# Patient Record
Sex: Male | Born: 1975 | Hispanic: Yes | Smoking: Never smoker
Health system: Southern US, Community
[De-identification: ages and names within clinical notes are randomized; demographics above are authoritative.]

## PROBLEM LIST (undated history)

## (undated) DIAGNOSIS — E119 Type 2 diabetes mellitus without complications: Secondary | ICD-10-CM

## (undated) HISTORY — DX: Type 2 diabetes mellitus without complications: E11.9

## (undated) HISTORY — DX: Morbid (severe) obesity due to excess calories: E66.01

## (undated) HISTORY — PX: FINGER SURGERY: SHX640

---

## 2013-11-10 ENCOUNTER — Ambulatory Visit (INDEPENDENT_AMBULATORY_CARE_PROVIDER_SITE_OTHER): Payer: Self-pay | Admitting: Family Medicine

## 2013-11-10 ENCOUNTER — Encounter: Payer: Self-pay | Admitting: Family Medicine

## 2013-11-10 VITALS — BP 126/84 | HR 85 | Temp 98.0°F | Resp 16 | Ht 67.0 in | Wt 259.2 lb

## 2013-11-10 DIAGNOSIS — M25549 Pain in joints of unspecified hand: Secondary | ICD-10-CM

## 2013-11-10 DIAGNOSIS — Z23 Encounter for immunization: Secondary | ICD-10-CM

## 2013-11-10 NOTE — Progress Notes (Signed)
Subjective:    Patient ID: John Coleman, male    DOB: 1975/12/27, 38 y.o.   MRN: [id number] This chart was scribed for John Staggers, MD by [name], Medical Scribe. The patient was seen in room 7. This patient's care was started at 2:00 PM.   HPI HPI Comments: John Coleman is a 38 y.o. male who presents to the [facility] with an avulsion to the right third finger occurring 30 minutes prior to arrival; bleeding uncontrolled. Pt states he was on his way to take his lunch break from work in [employer]. Says he had a knife in his hand and went to close a door behind him when the knife sliced his finger. Pt arrived with pressure applied using a white handkerchief that was drenched in blood. Last tetanus was more than 5 years ago. Pt is right hand dominant.  There are no active problems to display for this patient.  No past medical history on file. No past surgical history on file. Allergies not on file Prior to Admission medications   Not on File   History   Social History  . Marital Status: Married    Spouse Name: N/A    Number of Children: N/A  . Years of Education: N/A   Occupational History  . Not on file.   Social History Main Topics  . Smoking status: Not on file  . Smokeless tobacco: Not on file  . Alcohol Use: Not on file  . Drug Use: Not on file  . Sexual Activity: Not on file   Other Topics Concern  . Not on file   Social History Narrative  . No narrative on file   Review of Systems  Skin: Positive for rash.   Objective:  Physical Exam  Vitals reviewed. Constitutional: He is oriented to person, place, and time. He appears well-developed and well-nourished. No distress.  HENT:  Head: Normocephalic and atraumatic.  Eyes: EOM are normal.  Neck: Neck supple. No tracheal deviation present.  Cardiovascular: Normal rate.   Pulmonary/Chest: Effort normal. No respiratory distress.  Musculoskeletal: Normal range of motion.    Neurological: He is alert and oriented to person, place, and time.  Skin: Skin is warm and dry.  Sliced inside portion of third right finger. avulsion of skin into the soft tissue just distal to the nail. radial aspect of the finger pad. No apparent visualized bone but difficulty with bleeding. capillary refill less than one second at the tip of the finger. Nail was uninvolved.  Psychiatric: He has a normal mood and affect. His behavior is normal.   Filed Vitals:   11/10/13 1451  BP: 126/84  Pulse: 85  Temp: 98 F (36.7 C)  TempSrc: Oral  Resp: 16  Height: 5\' 7"  (1.702 m)  Weight: 259 lb 3.2 oz (117.572 kg)  SpO2: 97%     Assessment & Plan:  Lem Lans is a 38 y.o. male Need for prophylactic vaccination with combined diphtheria-tetanus-pertussis (DTP) vaccine - Plan: Tdap vaccine greater than or equal to 7yo IM  - avulsion of distal portion of R 3rd finger, occurred just PTA with clean knife by report while at work and closing door.  -Tdap given  -repaired via use of foil graft with plan on removal of sutures in 10-14 days. See procedure note. Wound care precautions given,  Including signs/symptoms of infections, discussed in spanish, with understanding expressed.   No orders of the defined types were placed in this encounter.  Patient Instructions  regrese en 10 -14 dias quitar suturas.  advil o tylenol si necesario por dolor. si mas rojo, mas dolor, mas enchado, o peor antes - regrese mas temprano.   CUIDADO DE LA HERIDA (Wound Care) Por favor vuelta en 10 das para hacer sus puntadas/ grapas quitar o ms pronto si usted tiene preocupaciones. [name]. Mantenga el rea limpia y seca por 24 horas. No quite el vendaje, si est aplicado. [name]. Despus de 24 horas, quita el vendaje y limpia la herida suavemente con cualquier tipo de [place] y agua tibia. Reaplique un vendaje nuevo despus de limpiar herida, si est dirigido. . Limpie la herida con el jabn y agua 1 a 2 veces al  [employer] representativeda hasta se quitan las puntadas. . No aplique ninguna ungentos ni cremas a la herida Wachovia Corporationmientras que las puntadas estn en lugar, pues sta puede causar curativo retrasado. . Notifique la oficina si usted tiene el siguiente de los muestras de la infeccin: Hinchazn, enrojecimiento, calor, drenaje del pus, de la fiebre > 101.0 F . Notifique la oficina si usted tiene la sangra excesiva eso no para despus de 15-20 minutos de presin firme y Risk analystconstante.

## 2013-11-10 NOTE — Patient Instructions (Signed)
regrese en 10 -14 dias quitar suturas.  advil o tylenol si necesario por dolor. si mas rojo, mas dolor, mas enchado, o peor antes - regrese mas temprano.   CUIDADO DE LA HERIDA (Wound Care) Por favor vuelta en 10 das para hacer sus puntadas/ grapas quitar o ms pronto si usted tiene preocupaciones. [name] el rea limpia y seca por 24 horas. No quite el vendaje, si est aplicado. [name] Despus de 24 horas, quita el vendaje y limpia la herida suavemente con cualquier tipo de [place] y agua tibia. Reaplique un vendaje nuevo despus de limpiar herida, si est dirigido. . Limpie la herida con el jabn y agua 1 a 2 veces al [employer] se quitan las puntadas. . No aplique ninguna ungentos ni cremas a la herida [employer] las puntadas estn en lugar, pues sta puede causar curativo retrasado. . Notifique la oficina si usted tiene el siguiente de los muestras de la infeccin: Hinchazn, enrojecimiento, calor, drenaje del pus, de la fiebre > 101.0 F . Notifique la oficina si usted tiene la sangra excesiva eso no para despus de 15-20 minutos de presin firme y Risk analyst.

## 2013-11-25 ENCOUNTER — Ambulatory Visit (INDEPENDENT_AMBULATORY_CARE_PROVIDER_SITE_OTHER): Payer: Self-pay | Admitting: Internal Medicine

## 2013-11-25 VITALS — BP 123/74 | HR 72 | Temp 99.1°F | Resp 18 | Ht 67.0 in | Wt 260.0 lb

## 2013-11-25 DIAGNOSIS — S61209A Unspecified open wound of unspecified finger without damage to nail, initial encounter: Secondary | ICD-10-CM

## 2013-11-25 NOTE — Patient Instructions (Signed)
Cuidados de una herida  (Wound Care)  El cuidado de la herida evita el dolor y la infeccin.  Deber aplicarse la vacuna contra el ttanos si:  No recuerda cundo se coloc la vacuna la ltima vez.  Nunca recibi esta vacuna.  La lesin ha Huntsman Corporationabierto su piel. Si usted necesita aplicarse la vacuna y se niega a recibirla, corre riesgo de contraer ttanos. sta es una enfermedad grave.  CUIDADOS EN EL HOGAR   Slo tome la medicacin segn las indicaciones.  Limpie la herida CarMaxtodos los das con [place] suave y [place], segn las indicaciones.  Cambie el (vendaje) tal como se le indic.  Aplique una crema con medicamento sobre la herida, segn le indique el mdico.  Cmbielo si se moja, se ensucia o huele mal.  Puede tomar Shaune Spittleuna ducha. No debe tomar baos de inmersin, nadar ni hacer nada que haga que la herida se encuentre debajo del agua.  Mantenga elevada y en reposo la zona lesionada hasta que el dolor y la hinchazn mejoren.  Cumpla con los controles mdicos segn las indicaciones. SOLICITE AYUDA DE INMEDIATO SI:   Observa que un lquido blanco amarillento (pus) aparece en la zona de la herida.  Los medicamentos no Editor, commissioningle calman el dolor.  Hay rayas rojas que salen de la herida.  Tiene fiebre. ASEGRESE DE QUE:   Comprende estas instrucciones.  Controlar su enfermedad.  Solicitar ayuda de inmediato si no mejora o si empeora. Document Released: 01/29/2011 Document Revised: 08/25/2011 [facility] Patient Information 2014 [employer], [place].

## 2013-11-25 NOTE — Progress Notes (Signed)
   Subjective:    Patient ID: John Coleman, male    DOB: 1976/06/06, 38 y.o.   MRN: [id number]  HPI Foil graft repair on distal finger pad has healed. No signs of infection Has full function.   Review of Systems     Objective:   Physical Exam  Constitutional: He is oriented to person, place, and time. He appears well-developed and well-nourished.  HENT:  Head: Normocephalic.  Eyes: EOM are normal.  Neck: Normal range of motion.  Pulmonary/Chest: Effort normal.  Musculoskeletal: Normal range of motion. He exhibits tenderness. He exhibits no edema.  Neurological: He is alert and oriented to person, place, and time. No cranial nerve deficit. He exhibits normal muscle tone. Coordination normal.  Skin: No rash noted.  Psychiatric: He has a normal mood and affect. His behavior is normal. Judgment and thought content normal.   All sutures removed  Wound healinf distal 3rd right finger pad, no reddness NMSV fully intact.       Assessment & Plan:  Wound care daily Protect wound.

## 2017-01-14 ENCOUNTER — Encounter (INDEPENDENT_AMBULATORY_CARE_PROVIDER_SITE_OTHER): Payer: Self-pay | Admitting: Physician Assistant

## 2017-01-14 ENCOUNTER — Ambulatory Visit (INDEPENDENT_AMBULATORY_CARE_PROVIDER_SITE_OTHER): Payer: Self-pay | Admitting: Physician Assistant

## 2017-01-14 VITALS — BP 131/76 | HR 73 | Temp 97.5°F | Ht 67.0 in | Wt 261.6 lb

## 2017-01-14 DIAGNOSIS — B353 Tinea pedis: Secondary | ICD-10-CM

## 2017-01-14 DIAGNOSIS — R1032 Left lower quadrant pain: Secondary | ICD-10-CM

## 2017-01-14 MED ORDER — CLOTRIMAZOLE 1 % EX CREA: 1.0000 "application " | TOPICAL_CREAM | Freq: Two times a day (BID) | CUTANEOUS | 0 refills | Status: DC

## 2017-01-14 NOTE — Progress Notes (Signed)
  Subjective:  Patient ID: John HaitSimon Hollingshead, male    DOB: [birth year]  Age: 41 y.o. MRN: [id number]  CC: abdominal pain  HPI John Coleman is a 41 y.o. male presents as a new patient with complaint of long standing hx of LLQ and LUQ pain. Attributed to a possible hernia. Worse with abdominal exercises and heavy lifting. Works in [employer] and feels that his pain is becoming more frequent. There is associated lump when pain is exacerbated. Also notes night sweats. Denies f/c/n/v, melena, BRBPR,BM dysfunction,weight loss, CP, palpitations, SOB, HA, swelling, rash, or GU sxs.    ROS Review of Systems  Constitutional: Negative for chills, fever and malaise/fatigue.  Eyes: Negative for blurred vision.  Respiratory: Negative for shortness of breath.   Cardiovascular: Negative for chest pain and palpitations.  Gastrointestinal: Positive for abdominal pain. Negative for nausea.  Genitourinary: Negative for dysuria and hematuria.  Musculoskeletal: Negative for joint pain and myalgias.  Skin: Positive for rash.  Neurological: Negative for tingling and headaches.  Psychiatric/Behavioral: Negative for depression. The patient is not nervous/anxious.     Objective:  BP 131/76 (BP Location: Left Arm, Patient Position: Sitting, Cuff Size: Large)   Pulse 73   Temp (!) 97.5 F (36.4 C) (Oral)   Ht 5\' 7"  (1.702 m)   Wt 261 lb 9.6 oz (118.7 kg)   SpO2 93%   BMI 40.97 kg/m   BP/Weight 01/14/2017 11/25/2013 11/10/2013  Systolic BP 131 123 126  Diastolic BP 76 74 84  Wt. (Lbs) 261.6 260 259.2  BMI 40.97 40.71 40.59      Physical Exam  Constitutional: He is oriented to person, place, and time.  Well developed, obese, NAD, polite  HENT:  Head: Normocephalic and atraumatic.  Eyes: No scleral icterus.  Neck: Normal range of motion. Neck supple. No thyromegaly present.  Cardiovascular: Normal rate, regular rhythm and normal heart sounds.   Pulmonary/Chest: Effort normal and breath sounds normal.   Abdominal: Soft. Bowel sounds are normal. There is tenderness (Focal LLQ TTP with no discernible hernia felt ).  Musculoskeletal: He exhibits no edema.  Neurological: He is alert and oriented to person, place, and time. No cranial nerve deficit. Coordination normal.  Skin: Skin is warm and dry. No rash noted. No erythema. No pallor.  Soles of feet erythematous and with desquamation  Psychiatric: He has a normal mood and affect. His behavior is normal. Thought content normal.  Vitals reviewed.    Assessment & Plan:    1. Left lower quadrant pain - CT Abdomen Pelvis W Contrast; Future - CBC with Differential - Comprehensive metabolic panel  2. Tinea pedis of both feet - Begin clotrimazole (LOTRIMIN) 1 % cream; Apply 1 application topically 2 (two) times daily.  Dispense: 30 g; Refill: 0   Meds ordered this encounter  Medications  . clotrimazole (LOTRIMIN) 1 % cream    Sig: Apply 1 application topically 2 (two) times daily.    Dispense:  30 g    Refill:  0    Order Specific Question:   Supervising Provider    Answer:   Quentin AngstJEGEDE, OLUGBEMIGA E [[id number]]    Follow-up: Return in about 2 weeks (around 01/28/2017).   Loletta Specteroger David Gomez PA

## 2017-01-14 NOTE — Patient Instructions (Signed)
Hernia en los adultos  (Hernia, Adult)  Una hernia es la protrusin de un rgano o un tejido a travs de un punto dbil en los msculos del abdomen (pared abdominal). La mayora de las veces, las hernias aparecen cerca del ombligo o de la ingle.  Hay muchos tipos de hernias. Los ms frecuentes incluyen los siguientes:   Hernia crural. Este tipo de hernia aparece por debajo de la ingle en la regin superior del muslo.   Hernia inguinal. Este tipo de hernia aparece en la ingle o en el escroto.   Hernia umbilical. Este tipo de hernia aparece cerca del ombligo.   Hernia de hiato. Este tipo de hernia produce el desplazamiento de una porcin del estmago hacia el trax.   Hernia incisional. Este tipo de hernia sobresale a travs de una cicatriz de una ciruga abdominal.  CAUSAS  Este trastorno puede ser causado por:   Levantar peso excesivo.   Toser durante mucho tiempo.   Dificultad para defecar.   Una incisin realizada durante una ciruga abdominal.   Un defecto congnito.   Sobrepeso u obesidad.   Fumar.   Dficit nutricional.   Fibrosis qustica.   Exceso de lquido en el abdomen.   Criptorquidia (testculos retenidos).  SNTOMAS  Los sntomas de una hernia incluyen lo siguiente:   Un bulto en el abdomen, que es el primer signo de una hernia. El bulto puede volverse ms evidente al estar de pie, hacer esfuerzos o toser. Puede aumentar de tamao con el tiempo si no se lo trata o si no se trata la afeccin que lo causa.   Dolor. Generalmente, las hernias son indoloras, pero pueden volverse dolorosas con el tiempo si se retrasa el tratamiento. El dolor suele ser sordo y puede ser ms intenso al estar de pie o levantar objetos pesados.  A veces, una hernia queda constreida en el punto dbil (estrangulada) o atascada (encarcelada) y causa ms sntomas. Estos sntomas pueden incluir los siguientes:   Vmitos.   Nuseas.   Estreimiento.   Irritabilidad.  DIAGNSTICO  El diagnstico de una hernia  puede realizarse mediante lo siguiente:   Un examen fsico. Durante el examen, el mdico puede pedirle que tosa o haga un movimiento especfico, porque, por lo general, una hernia es ms visible cuando la persona se mueve.   Diagnstico por imgenes. Estos pueden incluir los siguientes:  ? Radiografas.  ? Ecografa.  ? Tomografa computarizada.  TRATAMIENTO  Es posible que una hernia pequea e indolora no necesite tratamiento. Una hernia grande o dolorosa puede tratarse con ciruga. Para tratar las hernias inguinales, se puede recurrir a una ciruga para evitar el encarcelamiento o la estrangulacin. Las hernias estranguladas siempre se tratan con ciruga, porque la falta de irrigacin sangunea al rgano o al tejido atascados puede causar su muerte.  La ciruga para tratar una hernia incluye volver a colocar el bulto en su lugar y reparar la zona dbil del abdomen.  INSTRUCCIONES PARA EL CUIDADO EN EL HOGAR   No haga esfuerzos.   No levante ningn objeto que pese ms de 10libras (4,5kg).   Para hacerlo, use los msculos de las piernas, no los de la espalda. Esto ayuda a evitar las sobrecargas.   Cuando tosa, hgalo con suavidad.   Evitar el estreimiento. El estreimiento obliga a realizar esfuerzos de defecacin, los cuales pueden agravar una hernia o causar la rotura de la reparacin. Para evitar el estreimiento, puede hacer lo siguiente:  ? Consuma una dieta con alto contenido   de fibras que incluya gran cantidad de frutas y verduras.  ? Beba suficiente lquido para mantener la orina clara o de color amarillo plido. Pngase como objetivo beber 6 u 8vasos de agua por da.  ? Tome un ablandador de heces como se lo haya indicado el mdico.   Baje de peso, si tiene sobrepeso.   No consuma ningn producto que contenga tabaco, lo que incluye cigarrillos, tabaco de mascar o cigarrillos electrnicos. Si necesita ayuda para dejar de fumar, consulte al mdico.   Concurra a todas las visitas de control como  se lo haya indicado el mdico. Esto es importante. Es posible que el mdico deba controlar su cuadro clnico.    SOLICITE ATENCIN MDICA SI:   Tiene enrojecimiento, hinchazn o dolor en la zona afectada.   Sus hbitos intestinales han cambiado.    SOLICITE ATENCIN MDICA DE INMEDIATO SI:   Tiene fiebre.   Tiene dolor abdominal que empeora.   Siente nuseas o vomita.   No puede volver a colocar la hernia en su lugar al ejercer sobre esta una presin suave mientras est acostado.   La hernia:  ? Cambia de forma o de tamao.  ? Queda atascada fuera del abdomen.  ? Cambia de color.  ? Est dura al tacto o le causa dolor a la palpacin.    Esta informacin no tiene como fin reemplazar el consejo del mdico. Asegrese de hacerle al mdico cualquier pregunta que tenga.  Document Released: 06/02/2005 Document Revised: 06/23/2014 Document Reviewed: 04/12/2014  Elsevier Interactive Patient Education  2017 Elsevier Inc.

## 2017-01-15 LAB — COMPREHENSIVE METABOLIC PANEL
ALBUMIN: 3.8 g/dL (ref 3.5–5.5)
ALT: 49 IU/L — ABNORMAL HIGH (ref 0–44)
AST: 43 IU/L — ABNORMAL HIGH (ref 0–40)
Albumin/Globulin Ratio: 1.2 (ref 1.2–2.2)
Alkaline Phosphatase: 91 IU/L (ref 39–117)
BUN / CREAT RATIO: 16 (ref 9–20)
BUN: 10 mg/dL (ref 6–24)
Bilirubin Total: 0.6 mg/dL (ref 0.0–1.2)
CO2: 23 mmol/L (ref 20–29)
CREATININE: 0.63 mg/dL — AB (ref 0.76–1.27)
Calcium: 8.7 mg/dL (ref 8.7–10.2)
Chloride: 100 mmol/L (ref 96–106)
GFR calc non Af Amer: 123 mL/min/{1.73_m2} (ref >59)
GFR, EST AFRICAN AMERICAN: 143 mL/min/{1.73_m2} (ref >59)
GLOBULIN, TOTAL: 3.3 g/dL (ref 1.5–4.5)
Glucose: 263 mg/dL — ABNORMAL HIGH (ref 65–99)
Potassium: 3.9 mmol/L (ref 3.5–5.2)
Sodium: 137 mmol/L (ref 134–144)
TOTAL PROTEIN: 7.1 g/dL (ref 6.0–8.5)

## 2017-01-15 LAB — CBC WITH DIFFERENTIAL/PLATELET
BASOS ABS: 0 10*3/uL (ref 0.0–0.2)
Basos: 0 %
EOS (ABSOLUTE): 0.3 10*3/uL (ref 0.0–0.4)
EOS: 5 %
HEMATOCRIT: 41.3 % (ref 37.5–51.0)
HEMOGLOBIN: 14.3 g/dL (ref 13.0–17.7)
Immature Grans (Abs): 0 10*3/uL (ref 0.0–0.1)
Immature Granulocytes: 0 %
LYMPHS: 42 %
Lymphocytes Absolute: 2.3 10*3/uL (ref 0.7–3.1)
MCH: 30.8 pg (ref 26.6–33.0)
MCHC: 34.6 g/dL (ref 31.5–35.7)
MCV: 89 fL (ref 79–97)
MONOCYTES: 5 %
Monocytes Absolute: 0.3 10*3/uL (ref 0.1–0.9)
Neutrophils Absolute: 2.7 10*3/uL (ref 1.4–7.0)
Neutrophils: 48 %
Platelets: 182 10*3/uL (ref 150–379)
RBC: 4.64 x10E6/uL (ref 4.14–5.80)
RDW: 13.4 % (ref 12.3–15.4)
WBC: 5.6 10*3/uL (ref 3.4–10.8)

## 2017-01-16 ENCOUNTER — Ambulatory Visit: Payer: Self-pay | Attending: Physician Assistant

## 2017-01-16 ENCOUNTER — Encounter (INDEPENDENT_AMBULATORY_CARE_PROVIDER_SITE_OTHER): Payer: Self-pay | Admitting: Physician Assistant

## 2017-01-16 ENCOUNTER — Ambulatory Visit (INDEPENDENT_AMBULATORY_CARE_PROVIDER_SITE_OTHER): Payer: Self-pay | Admitting: Physician Assistant

## 2017-01-16 VITALS — BP 123/76 | HR 61 | Temp 97.9°F | Wt 259.2 lb

## 2017-01-16 DIAGNOSIS — R739 Hyperglycemia, unspecified: Secondary | ICD-10-CM

## 2017-01-16 DIAGNOSIS — E119 Type 2 diabetes mellitus without complications: Secondary | ICD-10-CM

## 2017-01-16 DIAGNOSIS — R748 Abnormal levels of other serum enzymes: Secondary | ICD-10-CM

## 2017-01-16 LAB — POCT GLYCOSYLATED HEMOGLOBIN (HGB A1C): Hemoglobin A1C: 10.5

## 2017-01-16 MED ORDER — METFORMIN HCL 500 MG PO TABS: 500.0000 mg | ORAL_TABLET | Freq: Two times a day (BID) | ORAL | 3 refills | Status: DC

## 2017-01-16 MED ORDER — BLOOD GLUCOSE MONITOR KIT: PACK | 0 refills | Status: AC

## 2017-01-16 MED ORDER — GLIMEPIRIDE 2 MG PO TABS: 2.0000 mg | ORAL_TABLET | Freq: Every day | ORAL | 3 refills | Status: DC

## 2017-01-16 MED FILL — TRUE METRIX TEST STRIP: 25 days supply | Qty: 100 | Fill #0

## 2017-01-16 MED FILL — TRUEplus LANCETS 28G MISC: 25 days supply | Qty: 100 | Fill #0

## 2017-01-16 MED FILL — !TRUE METRIX BLOOD GLUCOSE: 365 days supply | Qty: 1 | Fill #0

## 2017-01-16 NOTE — Progress Notes (Signed)
Subjective:  Patient ID: John Coleman, John Coleman    DOB: 15-Nov-1975  Age: 41 y.o. MRN: [id number]  CC: DM workup  HPI John Coleman is a 41 y.o. John Coleman with no significant PMH presents for f/u of abnormal labs showing hyperglycemia and mild transaminitis. States his regular diet consists of rice, beans, red meat, pork, chicken, eggs. Endorses polydipsia and polyphagia attributed to adjustment disorder from the passing of his mother. Denies polyuria, visual blurring, tingling, numbness. LFTs found to be slightly elevated. Pt does not drink, denies RUQ pain, abnormal stools, f/c/n/v, weight loss, or jaundice.     Outpatient Medications Prior to Visit  Medication Sig Dispense Refill  . clotrimazole (LOTRIMIN) 1 % cream Apply 1 application topically 2 (two) times daily. 30 g 0   No facility-administered medications prior to visit.      ROS Review of Systems  Constitutional: Negative for chills, fever and malaise/fatigue.  Eyes: Negative for blurred vision.  Respiratory: Negative for shortness of breath.   Cardiovascular: Negative for chest pain and palpitations.  Gastrointestinal: Negative for abdominal pain and nausea.  Genitourinary: Negative for dysuria and hematuria.  Musculoskeletal: Negative for joint pain and myalgias.  Skin: Negative for rash.  Neurological: Negative for tingling and headaches.  Endo/Heme/Allergies: Positive for polydipsia.  Psychiatric/Behavioral: Negative for depression. The patient is not nervous/anxious.     Objective:  BP 123/76 (BP Location: Left Arm, Patient Position: Sitting, Cuff Size: Large)   Pulse 61   Temp 97.9 F (36.6 C) (Oral)   Wt 259 lb 3.2 oz (117.6 kg)   SpO2 95%   BMI 40.60 kg/m   BP/Weight 01/16/2017 01/14/2017 11/25/2013  Systolic BP 123 131 123  Diastolic BP 76 76 74  Wt. (Lbs) 259.2 261.6 260  BMI 40.6 40.97 40.71      Physical Exam  Constitutional: He is oriented to person, place, and time.  Well developed, obese, NAD,  polite  HENT:  Head: Normocephalic and atraumatic.  Eyes: No scleral icterus.  Cardiovascular: Normal rate, regular rhythm and normal heart sounds.   Pulmonary/Chest: Effort normal and breath sounds normal.  Musculoskeletal: He exhibits no edema.  Neurological: He is alert and oriented to person, place, and time.  Skin: Skin is warm and dry. No rash noted. No erythema. No pallor.  Psychiatric: He has a normal mood and affect. His behavior is normal. Thought content normal.  Vitals reviewed.    Assessment & Plan:   1. Type 2 diabetes mellitus without complication, without long-term current use of insulin (HCC) - metFORMIN (GLUCOPHAGE) 500 MG tablet; Take 1 tablet (500 mg total) by mouth 2 (two) times daily with a meal.  Dispense: 180 tablet; Refill: 3 - glimepiride (AMARYL) 2 MG tablet; Take 1 tablet (2 mg total) by mouth daily before breakfast.  Dispense: 90 tablet; Refill: 3  2. Hyperglycemia - HgB A1c 10.5% in clinic today. Pt has new onset Diabetes.  3. Elevated liver enzymes - Very mild elevated of AST and ALT. Will repeat LFTs in 3 months. Counseled patient to abstain from fatty foods.  Meds ordered this encounter  Medications  . metFORMIN (GLUCOPHAGE) 500 MG tablet    Sig: Take 1 tablet (500 mg total) by mouth 2 (two) times daily with a meal.    Dispense:  180 tablet    Refill:  3    Order Specific Question:   Supervising Provider    Answer:   Quentin AngstJEGEDE, OLUGBEMIGA E [id number]]  . glimepiride (AMARYL) 2 MG tablet  Sig: Take 1 tablet (2 mg total) by mouth daily before breakfast.    Dispense:  90 tablet    Refill:  3    Order Specific Question:   Supervising Provider    Answer:   Quentin AngstJEGEDE, OLUGBEMIGA E [[id number]]    Follow-up: Return in about 3 months (around 04/18/2017) for DM2.   Loletta Specteroger David Gomez PA

## 2017-01-16 NOTE — Patient Instructions (Signed)
La diabetes mellitus y los alimentos (Diabetes Mellitus and Food) Es importante que controle su nivel de azcar en la sangre (glucosa). El nivel de glucosa en sangre depende en gran medida de lo que usted come. Comer alimentos saludables en las cantidades adecuadas a lo largo del da, aproximadamente a la misma hora todos los das, lo ayudar a controlar su nivel de glucosa en sangre. Tambin puede ayudarlo a retrasar o evitar el empeoramiento de la diabetes mellitus. Comer de manera saludable incluso puede ayudarlo a mejorar el nivel de presin arterial y a alcanzar o mantener un peso saludable. Entre las recomendaciones generales para alimentarse y cocinar los alimentos de forma saludable, se incluyen las siguientes:  Respetar las comidas principales y comer colaciones con regularidad. Evitar pasar largos perodos sin comer con el fin de perder peso.  Seguir una dieta que consista principalmente en alimentos de origen vegetal, como frutas, vegetales, frutos secos, legumbres y cereales integrales.  Utilizar mtodos de coccin a baja temperatura, como hornear, en lugar de mtodos de coccin a alta temperatura, como frer en abundante aceite. Trabaje con el nutricionista para aprender a usar la informacin nutricional de las etiquetas de los alimentos. CMO PUEDEN AFECTARME LOS ALIMENTOS? Carbohidratos Los carbohidratos afectan el nivel de glucosa en sangre ms que cualquier otro tipo de alimento. El nutricionista lo ayudar a determinar cuntos carbohidratos puede consumir en cada comida y ensearle a contarlos. El recuento de carbohidratos es importante para mantener la glucosa en sangre en un nivel saludable, en especial si utiliza insulina o toma determinados medicamentos para la diabetes mellitus. Alcohol El alcohol puede provocar disminuciones sbitas de la glucosa en sangre (hipoglucemia), en especial si utiliza insulina o toma determinados medicamentos para la diabetes mellitus. La  hipoglucemia es una afeccin que puede poner en peligro la vida. Los sntomas de la hipoglucemia (somnolencia, mareos y desorientacin) son similares a los sntomas de haber consumido mucho alcohol. Si el mdico lo autoriza a beber alcohol, hgalo con moderacin y siga estas pautas:  Las mujeres no deben beber ms de un trago por da, y los hombres no deben beber ms de dos tragos por da. Un trago es igual a: ? 12 onzas (355 ml) de cerveza ? 5 onzas de vino (150 ml) de vino ? 1,5onzas (45ml) de bebidas espirituosas  No beba con el estmago vaco.  Mantngase hidratado. Beba agua, gaseosas dietticas o t helado sin azcar.  Las gaseosas comunes, los jugos y otros refrescos podran contener muchos carbohidratos y se deben contar. QU ALIMENTOS NO SE RECOMIENDAN? Cuando haga las elecciones de alimentos, es importante que recuerde que todos los alimentos son distintos. Algunos tienen menos nutrientes que otros por porcin, aunque podran tener la misma cantidad de caloras o carbohidratos. Es difcil darle al cuerpo lo que necesita cuando consume alimentos con menos nutrientes. Estos son algunos ejemplos de alimentos que debera evitar ya que contienen muchas caloras y carbohidratos, pero pocos nutrientes:  Grasas trans (la mayora de los alimentos procesados incluyen grasas trans en la etiqueta de Informacin nutricional).  Gaseosas comunes.  Jugos.  Caramelos.  Dulces, como tortas, pasteles, rosquillas y galletas.  Comidas fritas. QU ALIMENTOS PUEDO COMER? Consuma alimentos ricos en nutrientes, que nutrirn el cuerpo y lo mantendrn saludable. Los alimentos que debe comer tambin dependern de varios factores, como:  Las caloras que necesita.  Los medicamentos que toma.  Su peso.  El nivel de glucosa en sangre.  El nivel de presin arterial.  El nivel de colesterol. Debe consumir   una amplia variedad de alimentos, por ejemplo:  Protenas. ? Cortes de carne  magros. ? Protenas con bajo contenido de grasas saturadas, como pescado, clara de huevo y frijoles. Evite las carnes procesadas.  Frutas y vegetales. ? Frutas y vegetales que pueden ayudar a controlar los niveles sanguneos de glucosa, como manzanas, mangos y batatas.  Productos lcteos. ? Elija productos lcteos sin grasa o con bajo contenido de grasa, como leche, yogur y queso.  Cereales, panes, pastas y arroz. ? Elija cereales integrales, como panes multicereales, avena en grano y arroz integral. Estos alimentos pueden ayudar a controlar la presin arterial.  Grasas. ? Alimentos que contengan grasas saludables, como frutos secos, aguacate, aceite de oliva, aceite de canola y pescado. TODOS LOS QUE PADECEN DIABETES MELLITUS TIENEN EL MISMO PLAN DE COMIDAS? Dado que todas las personas que padecen diabetes mellitus son distintas, no hay un solo plan de comidas que funcione para todos. Es muy importante que se rena con un nutricionista que lo ayudar a crear un plan de comidas adecuado para usted. Esta informacin no tiene como fin reemplazar el consejo del mdico. Asegrese de hacerle al mdico cualquier pregunta que tenga. Document Released: 09/09/2007 Document Revised: 06/23/2014 Document Reviewed: 04/29/2013 Elsevier Interactive Patient Education  2017 Elsevier Inc[employer] Control del nivel sanguneo de glucosa en los adultos (Blood Glucose Monitoring, Adult) El control del nivel de azcar (glucosa) en la sangre lo ayuda a tener la diabetes bajo control. Tambin ayuda a que usted y el mdico controlen si el tratamiento de la diabetes es eficaz. El control del nivel sanguneo de glucosa implica realizar controles regulares como lo indique el mdico y llevar registro de los resultados (registro diario). POR QU DEBO CONTROLAR EL NIVEL SANGUNEO DE GLUCOSA? Si controla su nivel sanguneo de glucosa con regularidad, podr:  Comprender de qu manera los alimentos, la actividad fsica, las  enfermedades y los medicamentos inciden en los niveles sanguneos de glucosa.  Conocer el nivel sanguneo de glucosa en cualquier momento dado. Saber rpidamente si el nivel es bajo (hipoglucemia) o alto (hiperglucemia).  Puede ser de ayuda para que usted y el mdico sepan cmo ajustar los medicamentos. CUNDO DEBO CONTROLAR EL NIVEL SANGUNEO DE GLUCOSA? Siga las indicaciones del mdico acerca de la frecuencia con la que debe controlar el nivel sanguneo de glucosa. La frecuencia puede depender de:  El tipo de diabetes que tenga.  Si su diabetes est bajo control.  Los medicamentos que toma. Si usted tiene diabetes tipo 1:  Controle su nivel sanguneo de glucosa al menos dos veces al da.  Tambin controle su nivel sanguneo de glucosa: ? Antes de cada inyeccin de insulina. ? Antes y despus de hacer ejercicio. ? Entre las comidas. ? Dos horas despus de una comida. ? Ocasionalmente, entre las 2:00a.m. y las 3:00a.m., como se lo hayan indicado. ? Antes de realizar tareas peligrosas, como manejar o usar maquinaria pesada. ? A la hora de acostarse.  Es posible que deba controlar con ms frecuencia los niveles sanguneos de glucosa, hasta 6 a 10veces por da: ? Si usa una bomba de insulina. ? Si necesita varias inyecciones diarias. ? Si su diabetes no est bien controlada. ? Si est enfermo. ? Si tiene antecedentes de hipoglucemia grave. ? Si tiene antecedentes de no darse cuenta cundo est bajando su nivel sanguneo de glucosa (hipoglucemia asintomtica). Si usted tiene diabetes tipo 2:  Si recibe insulina u otro medicamento para la diabetes, controle el nivel sanguneo de glucosa al menos dos veces al   da.  Mientras reciba tratamiento intensivo con insulina, debe medirse el nivel sanguneo de glucosa al menos 4veces al da. Ocasionalmente, es posible que deba controlarse entre las 2:00a.m. y las 3:00a.m., segn se lo indiquen.  Tambin controle su nivel sanguneo  de glucosa: ? Antes y despus de hacer ejercicio. ? Antes de realizar tareas peligrosas, como manejar o usar maquinaria pesada.  Es posible que deba controlar con ms frecuencia los niveles sanguneos de glucosa si: ? Es necesario ajustar la dosis de sus medicamentos. ? Su diabetes no est bien controlada. ? Est enfermo. QU ES UN REGISTRO DIARIO DEL NIVEL SANGUNEO DE GLUCOSA?  Un registro diario es un registro de los valores de glucosa en la sangre. Puede ayudarles a usted y a su mdico a: ? Buscar patrones en su nivel sanguneo de glucosa durante el transcurso del tiempo. ? Ajustar su plan de control de la diabetes como sea necesario.  Cada vez que controle su nivel sanguneo de glucosa, anote el resultado y aquellos factores que pueden estar afectando su nivel sanguneo de glucosa, como la dieta y la actividad fsica realizada en el da.  La mayora de los medidores de glucosa guardan un registro de las lecturas realizadas con el medidor. Algunos permiten descargar sus registros en una computadora.  CMO ME CONTROLO EL NIVEL SANGUNEO DE GLUCOSA? Siga los siguientes pasos para obtener lecturas precisas de su glucemia: Materiales necesarios  Medidor de glucosa en la sangre.  Tiras reactivas para el medidor. Cada medidor tiene sus propias tiras reactivas. Debe usar las tiras reactivas que trae su medidor.  Una aguja para pincharse el dedo (lanceta). No utilice la misma lanceta en ms de una ocasin.  Un dispositivo que sujeta la lanceta (dispositivo de puncin).  Un diario o libro de anotaciones para anotar los resultados. Procedimiento  Lvese las manos con agua y jabn.  Pnchese el costado del dedo (no la punta) con la lanceta. Use un dedo diferente cada vez.  Frote suavemente el dedo hasta que aparezca una pequea gota de sangre.  Siga las instrucciones que vienen con el medidor para insertar la tira reactiva, aplicar la sangre sobre la tira y usar el medidor de  glucosa en la sangre.  Registre el resultado y las observaciones que desee. Zonas del cuerpo alternativas para realizar las pruebas  Algunos medidores le permiten tomar sangre para la prueba de otras zonas del cuerpo que no son el dedo (zonas alternativas).  Si cree que tiene hipoglucemia o si tiene hipoglucemia asintomtica, no utilice las zonas alternativas del cuerpo. En su lugar, use los dedos.  Es posible que las zonas alternativas no sean tan precisas como los dedos porque el flujo de sangre es ms lento en esas zonas. Esto significa que el resultado que obtiene de estas zonas puede estar retrasado y ser un poco diferente del resultado que obtendra del dedo.  Los sitios alternativos ms comunes son los siguientes: ? Los antebrazos. ? Los muslos. ? La palma de la mano. Consejos adicionales  Siempre tenga los insumos a mano.  Todos los medidores de glucosa incluyen un nmero de telfono "directo", disponible las 24 horas, al que podr llamar si tiene preguntas o necesita ayuda. Tambin puede consultar a su mdico.  Despus de usar algunas cajas de tiras reactivas, ajuste (calibre) el medidor de glucemia segn las instrucciones del medidor. Esta informacin no tiene como fin reemplazar el consejo del mdico. Asegrese de hacerle al mdico cualquier pregunta que tenga. Document Released: 06/02/2005 Document Revised: 09/24/2015   Document Reviewed: 11/12/2015 Elsevier Interactive Patient Education  2017 Elsevier Inc.  

## 2017-01-19 ENCOUNTER — Telehealth (INDEPENDENT_AMBULATORY_CARE_PROVIDER_SITE_OTHER): Payer: Self-pay | Admitting: Physician Assistant

## 2017-01-19 ENCOUNTER — Ambulatory Visit (HOSPITAL_COMMUNITY)
Admission: RE | Admit: 2017-01-19 | Discharge: 2017-01-19 | Disposition: A | Discharge status: Home / Self Care | Payer: Self-pay | Source: Ambulatory Visit | Attending: Physician Assistant | Admitting: Physician Assistant

## 2017-01-19 DIAGNOSIS — K4090 Unilateral inguinal hernia, without obstruction or gangrene, not specified as recurrent: Secondary | ICD-10-CM | POA: Insufficient documentation

## 2017-01-19 DIAGNOSIS — R1032 Left lower quadrant pain: Secondary | ICD-10-CM | POA: Insufficient documentation

## 2017-01-19 DIAGNOSIS — K760 Fatty (change of) liver, not elsewhere classified: Secondary | ICD-10-CM | POA: Insufficient documentation

## 2017-01-19 MED ORDER — IOPAMIDOL (ISOVUE-300) INJECTION 61%
INTRAVENOUS | Status: AC
  Administered 2017-01-19: 100 mL
  Filled 2017-01-19: qty 100

## 2017-01-19 NOTE — Telephone Encounter (Signed)
Sorry, I will not change based on relative calling. Mr. John Coleman told me to send to his specific pharmacy. He should pick up at his pharmacy.

## 2017-01-19 NOTE — Telephone Encounter (Signed)
Patient's [facility] is calling to [facility] all  [facility] [address] Thank You

## 2017-01-19 NOTE — Telephone Encounter (Signed)
FWD to PCP. Lenetta Piche S Lilliam Chamblee, CMA  

## 2017-01-28 ENCOUNTER — Encounter (INDEPENDENT_AMBULATORY_CARE_PROVIDER_SITE_OTHER): Payer: Self-pay | Admitting: Physician Assistant

## 2017-01-28 ENCOUNTER — Ambulatory Visit (INDEPENDENT_AMBULATORY_CARE_PROVIDER_SITE_OTHER): Payer: Self-pay | Admitting: Physician Assistant

## 2017-01-28 VITALS — BP 137/88 | HR 65 | Temp 98.3°F | Wt 256.0 lb

## 2017-01-28 DIAGNOSIS — E119 Type 2 diabetes mellitus without complications: Secondary | ICD-10-CM

## 2017-01-28 DIAGNOSIS — F329 Major depressive disorder, single episode, unspecified: Secondary | ICD-10-CM

## 2017-01-28 DIAGNOSIS — K439 Ventral hernia without obstruction or gangrene: Secondary | ICD-10-CM

## 2017-01-28 DIAGNOSIS — F32A Depression, unspecified: Secondary | ICD-10-CM

## 2017-01-28 DIAGNOSIS — B353 Tinea pedis: Secondary | ICD-10-CM

## 2017-01-28 MED ORDER — GLIMEPIRIDE 2 MG PO TABS: 2.0000 mg | ORAL_TABLET | Freq: Every day | ORAL | 3 refills | Status: DC

## 2017-01-28 MED ORDER — ESCITALOPRAM OXALATE 10 MG PO TABS: 10.0000 mg | ORAL_TABLET | Freq: Every day | ORAL | 1 refills | Status: DC

## 2017-01-28 MED ORDER — METFORMIN HCL 500 MG PO TABS: 500.0000 mg | ORAL_TABLET | Freq: Two times a day (BID) | ORAL | 3 refills | Status: DC

## 2017-01-28 MED FILL — GLIMEPIRIDE 2 MG TABLET: 2 | 30 days supply | Qty: 30 | Fill #0

## 2017-01-28 MED FILL — ?METFORMIN HCL 500MG TABLET: 500 | 30 days supply | Qty: 60 | Fill #0

## 2017-01-28 MED FILL — ESCITALOPRAM 10 MG TABLET: 10 | 30 days supply | Qty: 30 | Fill #0

## 2017-01-28 NOTE — Progress Notes (Signed)
Subjective:  Patient ID: John Coleman, male    DOB: Oct 21, 1975  Age: 41 y.o. MRN: [id number]  CC: f/u DM and abdominal pain.  HPI John Coleman is a 41 y.o. male with a recent hx of new onset DM2. Says he was unable to fill his metformin, glimepiride, and clotrimazole. Was able to fill his glucometer and strips. Has checked his blood sugar a few times and has seen readings in the 180s.     His LLQ abdominal pain is present on occasion and only when lifting heavy items or during increased activities/exercise.  Has previously had to push and massage a "mass" back into the abdomen. Has felt the mass quiver at times. CT abdomin and pelvis found a fat containing hernia in the area of concern. Does not have insurance and therefore is not considering surgical referral at this time.      Outpatient Medications Prior to Visit  Medication Sig Dispense Refill  . blood glucose meter kit and supplies KIT Dispense based on patient and insurance preference. Use up to four times daily as directed. (FOR ICD-9 250.00, 250.01). 1 each 0  . clotrimazole (LOTRIMIN) 1 % cream Apply 1 application topically 2 (two) times daily. (Patient not taking: Reported on 01/28/2017) 30 g 0  . glimepiride (AMARYL) 2 MG tablet Take 1 tablet (2 mg total) by mouth daily before breakfast. (Patient not taking: Reported on 01/28/2017) 90 tablet 3  . metFORMIN (GLUCOPHAGE) 500 MG tablet Take 1 tablet (500 mg total) by mouth 2 (two) times daily with a meal. (Patient not taking: Reported on 01/28/2017) 180 tablet 3   No facility-administered medications prior to visit.      ROS Review of Systems  Constitutional: Negative for chills, fever and malaise/fatigue.  Eyes: Negative for blurred vision.  Respiratory: Negative for shortness of breath.   Cardiovascular: Negative for chest pain and palpitations.  Gastrointestinal: Positive for abdominal pain (LLQ pain on occasion). Negative for nausea.  Genitourinary: Negative for dysuria  and hematuria.  Musculoskeletal: Negative for joint pain and myalgias.  Skin: Negative for rash.  Neurological: Negative for tingling and headaches.  Psychiatric/Behavioral: Negative for depression. The patient is not nervous/anxious.       Objective:  BP 137/88 (BP Location: Left Arm, Patient Position: Sitting, Cuff Size: Large)   Pulse 65   Temp 98.3 F (36.8 C) (Oral)   Wt 256 lb (116.1 kg)   SpO2 94%   BMI 40.10 kg/m   BP/Weight 01/28/2017 11/17/8451 11/17/6801  Systolic BP 212 248 250  Diastolic BP 88 76 76  Wt. (Lbs) 256 259.2 261.6  BMI 40.1 40.6 40.97      Physical Exam  Constitutional: He is oriented to person, place, and time.  Well developed, obese, NAD, polite  HENT:  Head: Normocephalic and atraumatic.  Eyes: Conjunctivae are normal. No scleral icterus.  Neck: Normal range of motion. Neck supple. No thyromegaly present.  Cardiovascular: Normal rate, regular rhythm and normal heart sounds.   Pulmonary/Chest: Effort normal and breath sounds normal.  Abdominal: Soft. Bowel sounds are normal. There is no tenderness.  Musculoskeletal: He exhibits no edema.  Neurological: He is alert and oriented to person, place, and time. No cranial nerve deficit. Coordination normal.  Skin: Skin is warm and dry. No rash noted. No erythema. No pallor.  Psychiatric: He has a normal mood and affect. His behavior is normal. Thought content normal.  Vitals reviewed.    Assessment & Plan:    1. Type 2 diabetes  mellitus without complication, without long-term current use of insulin (HCC) - Refill metFORMIN (GLUCOPHAGE) 500 MG tablet; Take 1 tablet (500 mg total) by mouth    2 (two) times daily with a meal.  Dispense: 180 tablet; Refill: 3 - Refill glimepiride (AMARYL) 2 MG tablet; Take 1 tablet (2 mg total) by mouth daily before    breakfast.  Dispense: 90 tablet; Refill: 3 - Advised patient to communicate with [place] if there should be any other difficulties in obtaining  medications.  2. Tinea p-edis of both feet - Reduce blood sugar - Use clotrimazole as needed.  3. Ventral hernia - Seems patient has a reducible ventral hernia that was reduced during his CT abdomen and pelvis. There was a fat containing hernia found on CT but it seems through pt's history he may likely have a ventral hernia. Patient was advised to go to ED if at anytime pain is severe and/or hernia is not reducible. Pt foregoes surgical consult at this time.   4. Depression - Referral to psychology - Begin escitalopram  Meds ordered this encounter  Medications  . metFORMIN (GLUCOPHAGE) 500 MG tablet    Sig: Take 1 tablet (500 mg total) by mouth 2 (two) times daily with a meal.    Dispense:  180 tablet    Refill:  3    Order Specific Question:   Supervising Provider    Answer:   Tresa Garter [id number]  . glimepiride (AMARYL) 2 MG tablet    Sig: Take 1 tablet (2 mg total) by mouth daily before breakfast.    Dispense:  90 tablet    Refill:  3    Order Specific Question:   Supervising Provider    Answer:   Tresa Garter [[id number]]    Follow-up: Return in about 3 months (around 04/30/2017) for DM2.   Clent Demark PA

## 2017-01-28 NOTE — Patient Instructions (Signed)
  Hernia ventral  (Ventral Hernia) La hernia ventral (tambin llamada hernia incisional) aparece en el sitio de una herida quirrgica previa (incisin) en el abdomen. La pared abdominal se extiende desde la parte baja del trax hasta la pelvis. Si la pared abdominal se debilita por una incisin quirrgica, puede formarse una hernia. La hernia es una protuberancia del intestino o de tejido muscular que empuja hacia fuera en la parte debilitada de la pared abdominal. La hernia ventral puede agrandarse por un esfuerzo o por levantar objetos pesados.  Las personas mayores y obesas tienen un mayor riesgo de sufrir una hernia ventral. Tambin tienen un mayor riesgo las personas que desarrollan infecciones despus de la ciruga o que requieren repetidas incisiones en el mismo sitio del abdomen.  CAUSAS  La hernia ventral se produce debido a la debilidad de la pared abdominal, en el sitio de una incisin.  SNTOMAS  Los sntomas ms comunes son:   Un bulto visible o protuberancia en la pared abdominal.  Dolor o sensibilidad alrededor del bulto.  Aumento de las molestias al toser o al hacer un movimiento repentino. Si la hernia obstruye una parte del intestino, puede haber complicaciones graves (hernia encarcelada o estrangulada). Puede llegar a ser un problema que requiere ciruga de emergencia debido a que se corta el flujo de sangre al intestino bloqueado. Los sntomas pueden ser:   Ganas de vomitar (nuseas).  Vmitos.  Hinchazn en el estmago (distensin) o inflamacin.  Fiebre.  Frecuencia cardaca rpida. DIAGNSTICO  El mdico le har una historia clnica y le har un examen fsico. Le indicar varios estudios, como:   Anlisis de sangre.  Pruebas de orina.  Ecografas.  Radiografas.  Tomografa computada (TC). TRATAMIENTO  Una observacin atenta puede ser todo lo que se necesite para una hernia pequea que no causa sntomas. Su mdico puede recomendar el uso de un cinturn de  soporte (braguero) para mantener intacta la pared abdominal. Para las hernias ms grandes o dolorosas, se recomienda una ciruga para reparar la hernia. Si la hernia se estrangula, ser necesaria una ciruga de urgencia.  INSTRUCCIONES PARA EL CUIDADO EN EL HOGAR   Evite ejercer presin o tensin sobre el rea abdominal.  Evite levantar objetos pesados??.  Use una buena postura del cuerpo para realizar tareas fsicas. Consulte a su mdico acerca de las posturas apropiadas.  Use un cinturn de sostn segn las indicaciones de su mdico.  Mantenga un peso saludable.  Consuma alimentos ricos en fibra, como cereales integrales, frutas y vegetales. La fibra ayuda en el caso de dificultad para mover el intestino (constipacin).  Debe ingerir gran cantidad de lquido para mantener la orina de tono claro o color amarillo plido.  Concurra a las visitas de control, segn las indicaciones. SOLICITE ATENCIN MDICA SI:   La hernia es ms grande o ms dolorosa. SOLICITE ATENCIN MDICA DE INMEDIATO SI:   Siente dolor abdominal agudo y de inicio sbito.  El dolor se hace ms intenso.  Tiene vmitos persistentes.  Tiene sudoracin abundante.  Nota que la frecuencia cardaca est acelerada.  Tiene fiebre. ASEGRESE DE QUE:   Comprende estas instrucciones.  Controlar su enfermedad.  Solicitar ayuda de inmediato si no mejora o si empeora. Esta informacin no tiene como fin reemplazar el consejo del mdico. Asegrese de hacerle al mdico cualquier pregunta que tenga. Document Released: 09/27/2012 Elsevier Interactive Patient Education  2017 Elsevier Inc.  

## 2017-04-17 ENCOUNTER — Encounter (INDEPENDENT_AMBULATORY_CARE_PROVIDER_SITE_OTHER): Payer: Self-pay | Admitting: Physician Assistant

## 2017-04-17 ENCOUNTER — Ambulatory Visit (INDEPENDENT_AMBULATORY_CARE_PROVIDER_SITE_OTHER): Payer: Self-pay | Admitting: Physician Assistant

## 2017-04-17 VITALS — BP 128/78 | HR 73 | Temp 98.0°F | Resp 18 | Ht 64.0 in | Wt 262.0 lb

## 2017-04-17 DIAGNOSIS — E119 Type 2 diabetes mellitus without complications: Secondary | ICD-10-CM

## 2017-04-17 DIAGNOSIS — B353 Tinea pedis: Secondary | ICD-10-CM

## 2017-04-17 DIAGNOSIS — Z114 Encounter for screening for human immunodeficiency virus [HIV]: Secondary | ICD-10-CM

## 2017-04-17 DIAGNOSIS — Z23 Encounter for immunization: Secondary | ICD-10-CM

## 2017-04-17 LAB — POCT GLYCOSYLATED HEMOGLOBIN (HGB A1C): HEMOGLOBIN A1C: 7.4

## 2017-04-17 LAB — GLUCOSE, POCT (MANUAL RESULT ENTRY): POC GLUCOSE: 150 mg/dL — AB (ref 70–99)

## 2017-04-17 MED ORDER — GLIMEPIRIDE 2 MG PO TABS: 2.0000 mg | ORAL_TABLET | Freq: Every day | ORAL | 3 refills | Status: DC

## 2017-04-17 MED ORDER — METFORMIN HCL 500 MG PO TABS: 500.0000 mg | ORAL_TABLET | Freq: Two times a day (BID) | ORAL | 3 refills | Status: AC

## 2017-04-17 MED ORDER — CLOTRIMAZOLE 1 % EX CREA: 1.0000 "application " | TOPICAL_CREAM | Freq: Two times a day (BID) | CUTANEOUS | 0 refills | Status: DC

## 2017-04-17 NOTE — Progress Notes (Signed)
Subjective:  Patient ID: John Coleman, male    DOB: 1975-09-21  Age: 41 y.o. MRN: [id number]  CC: f/u DM2  HPI  John Coleman is a 41 y.o. male with a medical history of DM2 presents for f/u of DM2. He was newly diagnosed with DM2 three months ago. A1c 10.5% on 01/16/17. Has been taking his metformin and glimepiride as directed. Polydipsia and polyphagia have resolved. Tries to keep a lower carbohydrate diet and has reduced his portions. Does not exercise regularly as he considers his work to be taxing enough. Does not endorse polyuria, visual blurring, fatigue, tingling, numbness, CP, SOB, HA, abdominal pain, rash, or GI sxs..     Still has some burning and itching of the feet. Did not use clotrimazole 1% that was prescribed at last visit because he remembered it was ineffective for him at a previous time. He is using a steroid based cream and says his itching is better.     Outpatient Medications Prior to Visit  Medication Sig Dispense Refill  . blood glucose meter kit and supplies KIT Dispense based on patient and insurance preference. Use up to four times daily as directed. (FOR ICD-9 250.00, 250.01). 1 each 0  . clotrimazole (LOTRIMIN) 1 % cream Apply 1 application topically 2 (two) times daily. (Patient not taking: Reported on 01/28/2017) 30 g 0  . escitalopram (LEXAPRO) 10 MG tablet Take 1 tablet (10 mg total) by mouth daily. 30 tablet 1  . glimepiride (AMARYL) 2 MG tablet Take 1 tablet (2 mg total) by mouth daily before breakfast. 90 tablet 3  . metFORMIN (GLUCOPHAGE) 500 MG tablet Take 1 tablet (500 mg total) by mouth 2 (two) times daily with a meal. 180 tablet 3   No facility-administered medications prior to visit.      ROS Review of Systems  Constitutional: Negative for chills, fever and malaise/fatigue.  Eyes: Negative for blurred vision.  Respiratory: Negative for shortness of breath.   Cardiovascular: Negative for chest pain and palpitations.  Gastrointestinal:  Negative for abdominal pain and nausea.  Genitourinary: Negative for dysuria and hematuria.  Musculoskeletal: Negative for joint pain and myalgias.  Skin: Negative for rash.  Neurological: Negative for tingling and headaches.  Psychiatric/Behavioral: Negative for depression. The patient is not nervous/anxious.     Objective:  Blood pressure 128/78, pulse 73, temperature 98 F (36.7 C), temperature source Oral, resp. rate 18, height _0  (1.626 m), weight 262 lb (118.8 kg), SpO2 95 %.  Physical Exam  Constitutional: He is oriented to person, place, and time.  Well developed, obesity, NAD, polite  HENT:  Head: Normocephalic and atraumatic.  Eyes: Conjunctivae are normal. No scleral icterus.  Neck: Normal range of motion. Neck supple. No thyromegaly present.  Cardiovascular: Normal rate, regular rhythm and normal heart sounds.   Pulmonary/Chest: Effort normal and breath sounds normal.  Musculoskeletal: He exhibits no edema.  Neurological: He is alert and oriented to person, place, and time. No cranial nerve deficit. Coordination normal.  Skin: Skin is warm and dry. No rash noted. No erythema. No pallor.  Mild desquamation of the soles of feet  Psychiatric: He has a normal mood and affect. His behavior is normal. Thought content normal.  Vitals reviewed.    Assessment & Plan:    1. Type 2 diabetes mellitus without complication, without long-term current use of insulin (HCC) - HgB A1c 7.4% - Ambulatory referral to Ophthalmology - Microalbumin / creatinine urine ratio - TSH - Lipid panel - Comprehensive metabolic  panel - Glucose (CBG) 150 - Refill metFORMIN (GLUCOPHAGE) 500 MG tablet; Take 1 tablet (500 mg total) by mouth 2 (two) times daily with a meal.  Dispense: 180 tablet; Refill: 3 - Refill glimepiride (AMARYL) 2 MG tablet; Take 1 tablet (2 mg total) by mouth daily before breakfast.  Dispense: 90 tablet; Refill: 3   2. Need for prophylactic vaccination against  Streptococcus pneumoniae (pneumococcus) - Pneumococcal conjugate vaccine 13-valent  3. Need for prophylactic vaccination and inoculation against influenza - Flu Vaccine QUAD 6+ mos PF IM (Fluarix Quad PF)  4. Screening for HIV (human immunodeficiency virus) - HIV antibody  5. Tinea pedis of both feet - Refill clotrimazole (LOTRIMIN) 1 % cream; Apply 1 application topically 2 (two) times daily.  Dispense: 30 g; Refill: 0 - If clotrimazole is ineffective consider Quadriderm or terbinafine. I have asked patient to call and let me know if clotrimazole is effective for him now that his sugar is much lower.  Meds ordered this encounter  Medications  . metFORMIN (GLUCOPHAGE) 500 MG tablet    Sig: Take 1 tablet (500 mg total) by mouth 2 (two) times daily with a meal.    Dispense:  180 tablet    Refill:  3    Order Specific Question:   Supervising Provider    Answer:   Tresa Garter [id number]  . glimepiride (AMARYL) 2 MG tablet    Sig: Take 1 tablet (2 mg total) by mouth daily before breakfast.    Dispense:  90 tablet    Refill:  3    Order Specific Question:   Supervising Provider    Answer:   Tresa Garter [id number]  . clotrimazole (LOTRIMIN) 1 % cream    Sig: Apply 1 application topically 2 (two) times daily.    Dispense:  30 g    Refill:  0    Order Specific Question:   Supervising Provider    Answer:   Tresa Garter [[id number]]    Follow-up: 3 months  Clent Demark PA

## 2017-04-17 NOTE — Patient Instructions (Signed)
La diabetes mellitus y los alimentos (Diabetes Mellitus and Food) Es importante que controle su nivel de azcar en la sangre (glucosa). El nivel de glucosa en sangre depende en gran medida de lo que usted come. Comer alimentos saludables en las cantidades adecuadas a lo largo del da, aproximadamente a la misma hora todos los das, lo ayudar a controlar su nivel de glucosa en sangre. Tambin puede ayudarlo a retrasar o evitar el empeoramiento de la diabetes mellitus. Comer de manera saludable incluso puede ayudarlo a mejorar el nivel de presin arterial y a alcanzar o mantener un peso saludable. Entre las recomendaciones generales para alimentarse y cocinar los alimentos de forma saludable, se incluyen las siguientes:  Respetar las comidas principales y comer colaciones con regularidad. Evitar pasar largos perodos sin comer con el fin de perder peso.  Seguir una dieta que consista principalmente en alimentos de origen vegetal, como frutas, vegetales, frutos secos, legumbres y cereales integrales.  Utilizar mtodos de coccin a baja temperatura, como hornear, en lugar de mtodos de coccin a alta temperatura, como frer en abundante aceite. Trabaje con el nutricionista para aprender a usar la informacin nutricional de las etiquetas de los alimentos. CMO PUEDEN AFECTARME LOS ALIMENTOS? Carbohidratos Los carbohidratos afectan el nivel de glucosa en sangre ms que cualquier otro tipo de alimento. El nutricionista lo ayudar a determinar cuntos carbohidratos puede consumir en cada comida y ensearle a contarlos. El recuento de carbohidratos es importante para mantener la glucosa en sangre en un nivel saludable, en especial si utiliza insulina o toma determinados medicamentos para la diabetes mellitus. Alcohol El alcohol puede provocar disminuciones sbitas de la glucosa en sangre (hipoglucemia), en especial si utiliza insulina o toma determinados medicamentos para la diabetes mellitus. La  hipoglucemia es una afeccin que puede poner en peligro la vida. Los sntomas de la hipoglucemia (somnolencia, mareos y desorientacin) son similares a los sntomas de haber consumido mucho alcohol. Si el mdico lo autoriza a beber alcohol, hgalo con moderacin y siga estas pautas:  Las mujeres no deben beber ms de un trago por da, y los hombres no deben beber ms de dos tragos por da. Un trago es igual a:  12 onzas (355 ml) de cerveza  5 onzas de vino (150 ml) de vino  1,5onzas (45ml) de bebidas espirituosas  No beba con el estmago vaco.  Mantngase hidratado. Beba agua, gaseosas dietticas o t helado sin azcar.  Las gaseosas comunes, los jugos y otros refrescos podran contener muchos carbohidratos y se deben contar. QU ALIMENTOS NO SE RECOMIENDAN? Cuando haga las elecciones de alimentos, es importante que recuerde que todos los alimentos son distintos. Algunos tienen menos nutrientes que otros por porcin, aunque podran tener la misma cantidad de caloras o carbohidratos. Es difcil darle al cuerpo lo que necesita cuando consume alimentos con menos nutrientes. Estos son algunos ejemplos de alimentos que debera evitar ya que contienen muchas caloras y carbohidratos, pero pocos nutrientes:  Grasas trans (la mayora de los alimentos procesados incluyen grasas trans en la etiqueta de Informacin nutricional).  Gaseosas comunes.  Jugos.  Caramelos.  Dulces, como tortas, pasteles, rosquillas y galletas.  Comidas fritas. QU ALIMENTOS PUEDO COMER? Consuma alimentos ricos en nutrientes, que nutrirn el cuerpo y lo mantendrn saludable. Los alimentos que debe comer tambin dependern de varios factores, como:  Las caloras que necesita.  Los medicamentos que toma.  Su peso.  El nivel de glucosa en sangre.  El nivel de presin arterial.  El nivel de colesterol. Debe consumir   una amplia variedad de alimentos, por ejemplo:  Protenas.  Cortes de carne  magros.  Protenas con bajo contenido de grasas saturadas, como pescado, clara de huevo y frijoles. Evite las carnes procesadas.  Frutas y vegetales.  Frutas y vegetales que pueden ayudar a controlar los niveles sanguneos de glucosa, como manzanas, mangos y batatas.  Productos lcteos.  Elija productos lcteos sin grasa o con bajo contenido de grasa, como leche, yogur y queso.  Cereales, panes, pastas y arroz.  Elija cereales integrales, como panes multicereales, avena en grano y arroz integral. Estos alimentos pueden ayudar a controlar la presin arterial.  Grasas.  Alimentos que contengan grasas saludables, como frutos secos, aguacate, aceite de oliva, aceite de canola y pescado. TODOS LOS QUE PADECEN DIABETES MELLITUS TIENEN EL MISMO PLAN DE COMIDAS? Dado que todas las personas que padecen diabetes mellitus son distintas, no hay un solo plan de comidas que funcione para todos. Es muy importante que se rena con un nutricionista que lo ayudar a crear un plan de comidas adecuado para usted. Esta informacin no tiene como fin reemplazar el consejo del mdico. Asegrese de hacerle al mdico cualquier pregunta que tenga. Document Released: 09/09/2007 Document Revised: 06/23/2014 Document Reviewed: 04/29/2013 Elsevier Interactive Patient Education  2017 Elsevier Inc.  

## 2017-04-18 LAB — MICROALBUMIN / CREATININE URINE RATIO
Creatinine, Urine: 137.5 mg/dL
MICROALBUM., U, RANDOM: 20.9 ug/mL
Microalb/Creat Ratio: 15.2 mg/g creat (ref 0.0–30.0)

## 2017-04-18 LAB — COMPREHENSIVE METABOLIC PANEL
A/G RATIO: 1.3 (ref 1.2–2.2)
ALT: 36 IU/L (ref 0–44)
AST: 25 IU/L (ref 0–40)
Albumin: 4.2 g/dL (ref 3.5–5.5)
Alkaline Phosphatase: 76 IU/L (ref 39–117)
BILIRUBIN TOTAL: 0.5 mg/dL (ref 0.0–1.2)
BUN / CREAT RATIO: 15 (ref 9–20)
BUN: 12 mg/dL (ref 6–24)
CALCIUM: 9.1 mg/dL (ref 8.7–10.2)
CHLORIDE: 102 mmol/L (ref 96–106)
CO2: 24 mmol/L (ref 20–29)
CREATININE: 0.79 mg/dL (ref 0.76–1.27)
GFR, EST AFRICAN AMERICAN: 129 mL/min/{1.73_m2} (ref >59)
GFR, EST NON AFRICAN AMERICAN: 112 mL/min/{1.73_m2} (ref >59)
Globulin, Total: 3.2 g/dL (ref 1.5–4.5)
Glucose: 134 mg/dL — ABNORMAL HIGH (ref 65–99)
Potassium: 4.2 mmol/L (ref 3.5–5.2)
SODIUM: 141 mmol/L (ref 134–144)
Total Protein: 7.4 g/dL (ref 6.0–8.5)

## 2017-04-18 LAB — LIPID PANEL
CHOL/HDL RATIO: 3.5 ratio (ref 0.0–5.0)
Cholesterol, Total: 138 mg/dL (ref 100–199)
HDL: 39 mg/dL — AB (ref >39)
LDL CALC: 70 mg/dL (ref 0–99)
TRIGLYCERIDES: 143 mg/dL (ref 0–149)
VLDL Cholesterol Cal: 29 mg/dL (ref 5–40)

## 2017-04-18 LAB — TSH: TSH: 3 u[IU]/mL (ref 0.450–4.500)

## 2017-04-18 LAB — HIV ANTIBODY (ROUTINE TESTING W REFLEX): HIV Screen 4th Generation wRfx: NONREACTIVE

## 2017-07-17 ENCOUNTER — Ambulatory Visit (INDEPENDENT_AMBULATORY_CARE_PROVIDER_SITE_OTHER): Payer: Self-pay | Admitting: Physician Assistant

## 2017-07-27 ENCOUNTER — Ambulatory Visit (INDEPENDENT_AMBULATORY_CARE_PROVIDER_SITE_OTHER): Payer: Self-pay | Admitting: Physician Assistant

## 2017-07-27 ENCOUNTER — Encounter (INDEPENDENT_AMBULATORY_CARE_PROVIDER_SITE_OTHER): Payer: Self-pay | Admitting: Physician Assistant

## 2017-07-27 VITALS — BP 127/83 | HR 87 | Temp 98.1°F | Resp 12 | Wt 274.0 lb

## 2017-07-27 DIAGNOSIS — R400 Somnolence: Secondary | ICD-10-CM

## 2017-07-27 DIAGNOSIS — Z6841 Body Mass Index (BMI) 40.0 and over, adult: Secondary | ICD-10-CM

## 2017-07-27 DIAGNOSIS — E119 Type 2 diabetes mellitus without complications: Secondary | ICD-10-CM

## 2017-07-27 LAB — GLUCOSE, POCT (MANUAL RESULT ENTRY): POC GLUCOSE: 155 mg/dL — AB (ref 70–99)

## 2017-07-27 LAB — POCT GLYCOSYLATED HEMOGLOBIN (HGB A1C): Hemoglobin A1C: 6.5

## 2017-07-27 MED ORDER — PHENTERMINE HCL 15 MG PO CAPS: 15.0000 mg | ORAL_CAPSULE | ORAL | 0 refills | Status: DC

## 2017-07-27 NOTE — Patient Instructions (Signed)
Diabetes mellitus y nutrición  Diabetes Mellitus and Nutrition  Si sufre de diabetes (diabetes mellitus), es muy importante tener hábitos alimenticios saludables debido a que sus niveles de azúcar en la sangre (glucosa) se ven afectados en gran medida por lo que come y bebe. Comer alimentos saludables en las cantidades adecuadas, aproximadamente a la misma hora todos los días, lo ayudará a:  · Controlar la glucemia.  · Disminuir el riesgo de sufrir una enfermedad cardíaca.  · Mejorar la presión arterial.  · Alcanzar o mantener un peso saludable.    Todas las personas que sufren de diabetes son diferentes y cada una tiene necesidades diferentes en cuanto a un plan de alimentación. El médico puede recomendarle que trabaje con un especialista en dietas y nutrición (nutricionista) para elaborar el mejor plan para usted. Su plan de alimentación puede variar según factores como:  · Las calorías que necesita.  · Los medicamentos que toma.  · Su peso.  · Sus niveles de glucemia, presión arterial y colesterol.  · Su nivel de actividad.  · Otras afecciones que tenga, como enfermedades cardíacas o renales.    ¿Cómo me afectan los carbohidratos?  Los carbohidratos afectan el nivel de glucemia más que cualquier otro tipo de alimento. La ingesta de carbohidratos naturalmente aumenta la cantidad glucosa en la sangre. El recuento de carbohidratos es un método destinado a llevar un registro de la cantidad de carbohidratos que se ingieren. El recuento de carbohidratos es importante para mantener la glucemia a un nivel saludable, en especial si utiliza insulina o toma determinados medicamentos por vía oral para la diabetes.  Es importante saber la cantidad de carbohidratos que se pueden ingerir en cada comida sin correr ningún riesgo. Esto es diferente en cada persona. El nutricionista puede ayudarlo a calcular la cantidad de carbohidratos que debe ingerir en cada comida y colación.   Los alimentos que contienen carbohidratos incluyen:  · Pan, cereal, arroz, pasta y galletas.  · Papas y maíz.  · Guisantes, frijoles y lentejas.  · Leche y yogur.  · Frutas y jugo.  · Postres, como pasteles, galletitas, helado y caramelos.    ¿Cómo me afecta el alcohol?  El alcohol puede provocar disminuciones súbitas de la glucemia (hipoglucemia), en especial si utiliza insulina o toma determinados medicamentos por vía oral para la diabetes. La hipoglucemia es una afección potencialmente mortal. Los síntomas de la hipoglucemia (somnolencia, mareos y confusión) son similares a los síntomas de haber consumido demasiado alcohol.  Si el médico afirma que el alcohol es seguro para usted, siga estas pautas:  · Limite el consumo de alcohol a no más de 1 medida por día si es mujer y no está embarazada, y a 2 medidas si es hombre. Una medida equivale a 12 oz (355 ml) de cerveza, 5 oz (148 ml) de vino o 1½ oz (44 ml) de bebidas de alta graduación alcohólica.  · No beba con el estómago vacío.  · Manténgase hidratado con agua, gaseosas dietéticas o té helado sin azúcar.  · Tenga en cuenta que las gaseosas comunes, los jugos y otros refrescos pueden contener mucha azúcar y se deben contar como carbohidratos.    Consejos para seguir este plan  Leer las etiquetas de los alimentos  · Comience por controlar el tamaño de la porción en la etiqueta. La cantidad de calorías, carbohidratos, grasas y otros nutrientes mencionados en la etiqueta se basan en una porción del alimento. Muchos alimentos contienen más de una porción por envase.  · Verifique la cantidad total de gramos (g)   de carbohidratos totales en una porción. Puede calcular la cantidad de porciones de carbohidratos al dividir el total de carbohidratos por 15. Por ejemplo, si un alimento posee un total de 30 g de carbohidratos, equivale a 2 porciones de carbohidratos.  · Verifique la cantidad de gramos (g) de grasas saturadas y grasas trans  en una porción. Escoja alimentos que no contengan grasa o que tengan un bajo contenido.  · Controle la cantidad de miligramos (mg) de sodio en una porción. La mayoría de las personas deben limitar la ingesta de sodio total a menos de 2300 mg por día.  · Siempre consulte la información nutricional de los alimentos etiquetados como “con bajo contenido de grasa” o “sin grasa”. Estos alimentos pueden ser más altos en azúcar agregada o en carbohidratos refinados y deben evitarse.  · Hable con el nutricionista para identificar sus objetivos diarios en cuanto a los nutrientes mencionados en la etiqueta.  De compras  · Evite comprar alimentos procesados, enlatados o prehechos. Estos alimentos tienden a tener mayor cantidad de grasa, sodio y azúcar agregada.  · Compre en la zona exterior de la tienda de comestibles. Esta incluye frutas y vegetales frescos, granos a granel, carnes frescas y productos lácteos frescos.  Cocción  · Utilice métodos de cocción a baja temperatura, como hornear, en lugar de métodos de cocción a alta temperatura, como freír en abundante aceite.  · Cocine con aceites saludables, como el aceite de oliva, canola o girasol.  · Evite cocinar con manteca, crema o carnes con alto contenido de grasa.  Planificación de las comidas  · Consuma las comidas y las colaciones de forma regular, preferentemente a la misma hora todos los días. Evite pasar largos períodos de tiempo sin comer.  · Consuma alimentos ricos en fibra, como frutas frescas, verduras, frijoles y cereales integrales. Consulte al nutricionista sobre cuántas porciones de carbohidratos puede consumir en cada comida.  · Consuma entre 4 y 6 onzas de proteínas magras por día, como carnes magras, pollo, pescado, huevos o tofu. 1 onza equivale a 1 onza de carne, pollo o pescado, 1 huevo, o 1/4 taza de tofu.  · Coma algunos alimentos por día que contengan grasas saludables, como aguacates, frutos secos, semillas y pescado.  Estilo de vida     · Controle su nivel de glucemia con regularidad.  · Haga ejercicio al menos 30 minutos, 5 días o más por semana, o como se lo haya indicado el médico.  · Tome los medicamentos como se lo haya indicado el médico.  · No consuma ningún producto que contenga nicotina o tabaco, como cigarrillos y cigarrillos electrónicos. Si necesita ayuda para dejar de fumar, consulte al médico.  · Trabaje con un asesor o instructor en diabetes para identificar estrategias para controlar el estrés y cualquier desafío emocional y social.  ¿Cuáles son algunas de las preguntas que puedo hacerle a mi médico?  · ¿Es necesario que me reúna con un instructor en diabetes?  · ¿Es necesario que me reúna con un nutricionista?  · ¿A qué número puedo llamar si tengo preguntas?  · ¿Cuáles son los mejores momentos para controlar la glucemia?  Dónde encontrar más información:  · Asociación Americana de la Diabetes (American Diabetes Association): diabetes.org/food-and-fitness/food  · Academia de Nutrición y Dietética (Academy of Nutrition and Dietetics): www.eatright.org/resources/health/diseases-and-conditions/diabetes  · Instituto Nacional de la Diabetes y las Enfermedades Digestivas y Renales (National Institute of Diabetes and Digestive and Kidney Diseases) (Institutos Nacionales de Salud, NIH): www.niddk.nih.gov/health-information/diabetes/overview/diet-eating-physical-activity  Resumen  · Un plan de alimentación saludable   lo ayudará a controlar la glucemia y mantener un estilo de vida saludable.  · Trabajar con un especialista en dietas y nutrición (nutricionista) puede ayudarlo a elaborar el mejor plan de alimentación para usted.  · Tenga en cuenta que los carbohidratos y el alcohol tienen efectos inmediatos en sus niveles de glucemia. Es importante contar los carbohidratos y consumir alcohol con prudencia.  Esta información no tiene como fin reemplazar el consejo del médico. Asegúrese de hacerle al médico cualquier pregunta que tenga.   Document Released: 09/09/2007 Document Revised: 09/22/2016 Document Reviewed: 09/22/2016  Elsevier Interactive Patient Education © 2018 Elsevier Inc.

## 2017-07-27 NOTE — Progress Notes (Signed)
F/u DM 

## 2017-07-27 NOTE — Progress Notes (Signed)
 Subjective:  Patient ID: John Coleman, male    DOB: 06/27/1975  Age: 41 y.o. MRN: [id number]  CC: f/u DM  HPI John Coleman is a 41 y.o. male with a medical history of DM2 presents on f/u of DM2. States he is feeling well. Last A1c 7.4% three months ago. A1c 6.5% today in clinic. Taking Metformin 500 mg BID and glimepiride as directed. Says he has changed his diet to include eating less bread. Has reduced portions of food he eats. Only complaint is of daytime somnolence. Attributed to irregular sleep hours. Endorses exertional dyspnea attributed to his weight and deconditioning. Does not endorse CP, palpitations, SOB, HA, abdominal pain, f/c/n/v, or GI/GU sxs.         Outpatient Medications Prior to Visit  Medication Sig Dispense Refill  . blood glucose meter kit and supplies KIT Dispense based on patient and insurance preference. Use up to four times daily as directed. (FOR ICD-9 250.00, 250.01). 1 each 0  . glimepiride (AMARYL) 2 MG tablet Take 1 tablet (2 mg total) by mouth daily before breakfast. 90 tablet 3  . metFORMIN (GLUCOPHAGE) 500 MG tablet Take 1 tablet (500 mg total) by mouth 2 (two) times daily with a meal. 180 tablet 3  . clotrimazole (LOTRIMIN) 1 % cream Apply 1 application topically 2 (two) times daily. (Patient not taking: Reported on 07/27/2017) 30 g 0   No facility-administered medications prior to visit.      ROS Review of Systems  Constitutional: Negative for chills, fever and malaise/fatigue.  Eyes: Negative for blurred vision.  Respiratory: Negative for shortness of breath.   Cardiovascular: Negative for chest pain and palpitations.  Gastrointestinal: Negative for abdominal pain and nausea.  Genitourinary: Negative for dysuria and hematuria.  Musculoskeletal: Negative for joint pain and myalgias.  Skin: Negative for rash.  Neurological: Negative for tingling and headaches.  Psychiatric/Behavioral: Negative for depression. The patient is not  nervous/anxious.     Objective:  BP 127/83 (BP Location: Left Arm, Patient Position: Sitting, Cuff Size: Large)   Pulse 87   Temp 98.1 F (36.7 C) (Oral)   Resp 12   Wt 274 lb (124.3 kg)   SpO2 95%   BMI 47.03 kg/m   BP/Weight 07/27/2017 04/17/2017 01/28/2017  Systolic BP 127 128 137  Diastolic BP 83 78 88  Wt. (Lbs) 274 262 256  BMI 47.03 44.97 40.1      Physical Exam  Constitutional: He is oriented to person, place, and time.  Well developed, obese, NAD, polite, pt asleep and snoring  HENT:  Head: Normocephalic and atraumatic.  Eyes: Conjunctivae and EOM are normal. Pupils are equal, round, and reactive to light. No scleral icterus.  Neck: Normal range of motion. Neck supple. No thyromegaly present.  Cardiovascular: Normal rate, regular rhythm and normal heart sounds.  Pulmonary/Chest: Effort normal and breath sounds normal.  Musculoskeletal: He exhibits no edema.  Neurological: He is alert and oriented to person, place, and time. No cranial nerve deficit. Coordination normal.  Skin: Skin is warm and dry. No rash noted. No erythema. No pallor.  Psychiatric: He has a normal mood and affect. His behavior is normal. Thought content normal.  Vitals reviewed.    Assessment & Plan:    1. Type 2 diabetes mellitus without complication, without long-term current use of insulin (HCC) - HgB A1c 6.5% in clinic today. Down from 7.4%. - Glucose (CBG)  2. Daytime somnolence - Consider sleep studies if patient does not lose weight. Patient would   also have to obtain financial assistance before sending for sleep studies.  3. Class 3 severe obesity with serious comorbidity and body mass index (BMI) of 45.0 to 49.9 in adult, unspecified obesity type (HCC) - Begin phentermine 15 MG capsule; Take 1 capsule (15 mg total) by mouth every morning.  Dispense: 30 capsule; Refill: 0 - Pt advised on the cardiac side effects of phentermine    Meds ordered this encounter  Medications  .  phentermine 15 MG capsule    Sig: Take 1 capsule (15 mg total) by mouth every morning.    Dispense:  30 capsule    Refill:  0    Order Specific Question:   Supervising Provider    Answer:   JEGEDE, OLUGBEMIGA E [[id number]    Follow-up: Return in about 6 months (around 01/24/2018) for diabetes.    David  PA         

## 2018-01-18 ENCOUNTER — Inpatient Hospital Stay (INDEPENDENT_AMBULATORY_CARE_PROVIDER_SITE_OTHER): Payer: Self-pay | Admitting: Physician Assistant

## 2018-02-23 ENCOUNTER — Ambulatory Visit (INDEPENDENT_AMBULATORY_CARE_PROVIDER_SITE_OTHER): Payer: Self-pay | Admitting: Physician Assistant

## 2018-02-23 ENCOUNTER — Encounter

## 2018-05-29 IMAGING — CT CT ABD-PELV W/ CM
2 of 5 series · 16 of 46 positions shown, 18 images · IV contrast (Omni 300)
Comparison: None available.

CLINICAL DATA: Initial evaluation for acute left lower quadrant
pain, suspected hernia.

EXAM:
CT ABDOMEN AND PELVIS WITH CONTRAST
TECHNIQUE: Multidetector CT imaging of the abdomen and pelvis was performed
using the standard protocol following bolus administration of
intravenous contrast.
CONTRAST:  100mL 5ZTNMI-722 IOPAMIDOL (5ZTNMI-722) INJECTION 61%

[Series 3: a/p w/ 5mm · axial · 0.98mm/px · z∈[+644,+1090]mm · 13 of 101 slices shown, 15 images]
[im 6/101  soft-tissue]
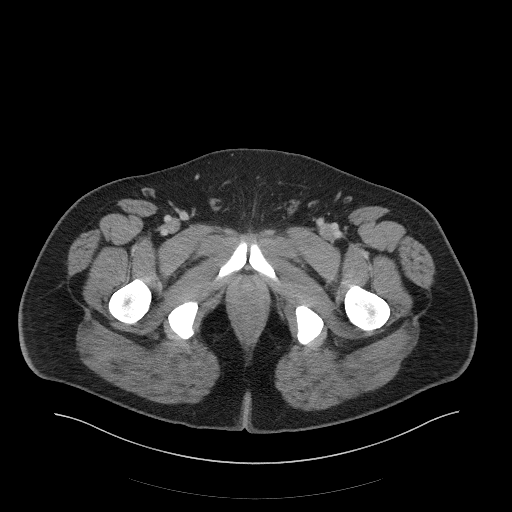
[im 6/101  bone]
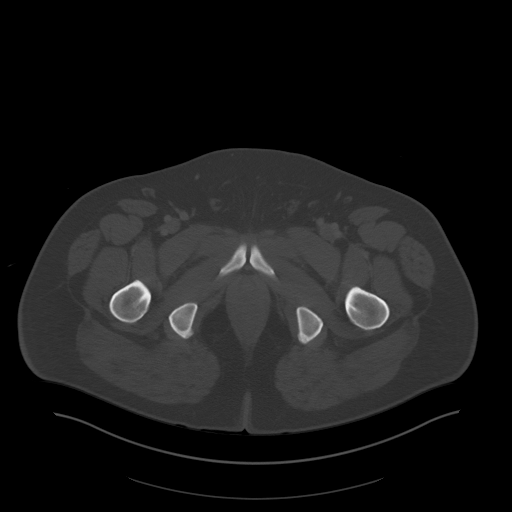
[im 12/101  soft-tissue]
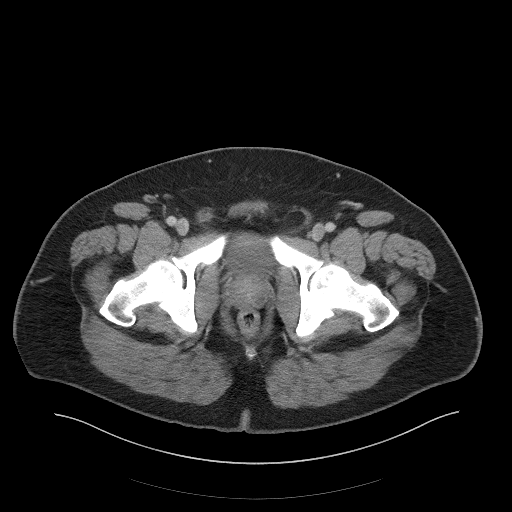
[im 23/101  soft-tissue]
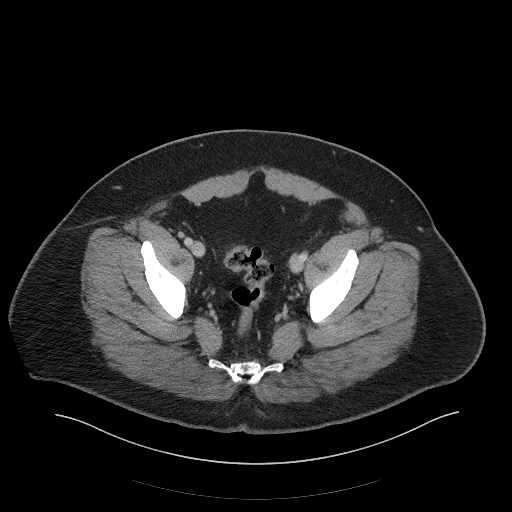
[im 28/101  soft-tissue]
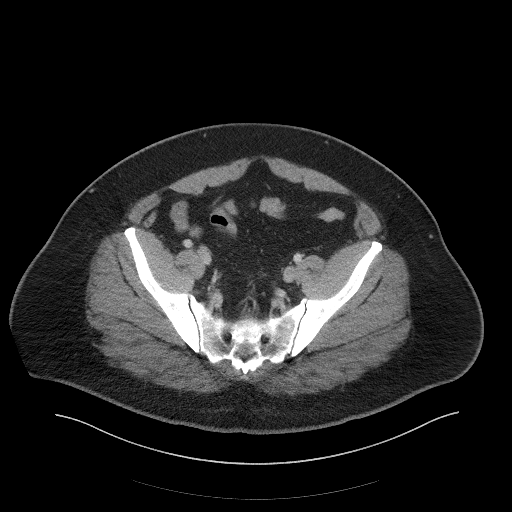
[im 34/101  soft-tissue]
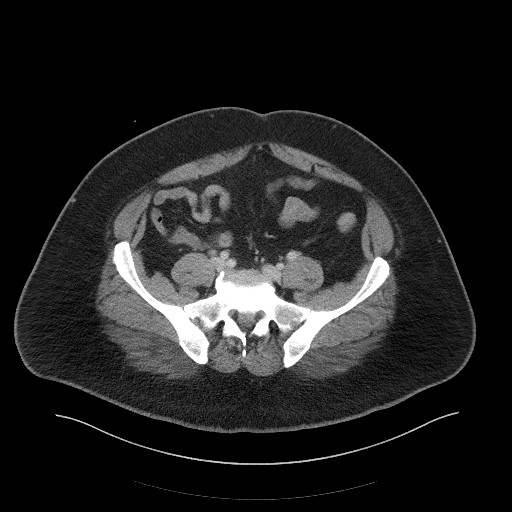
[im 45/101  soft-tissue]
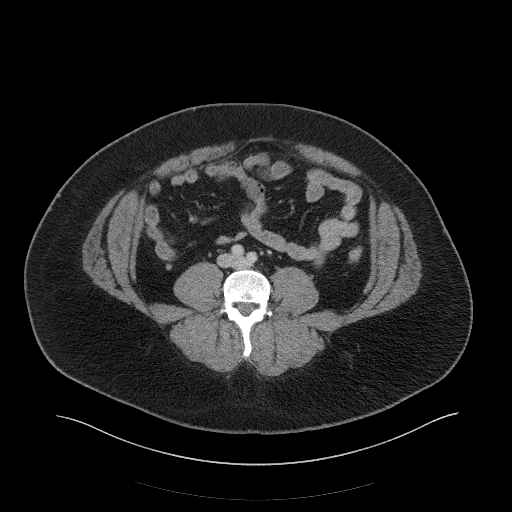
[im 51/101  soft-tissue]
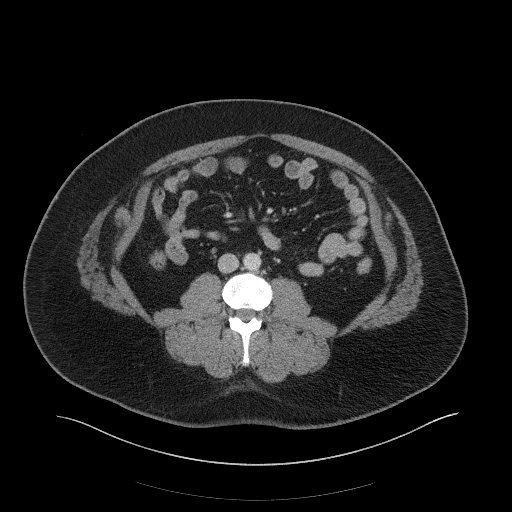
[im 56/101  soft-tissue]
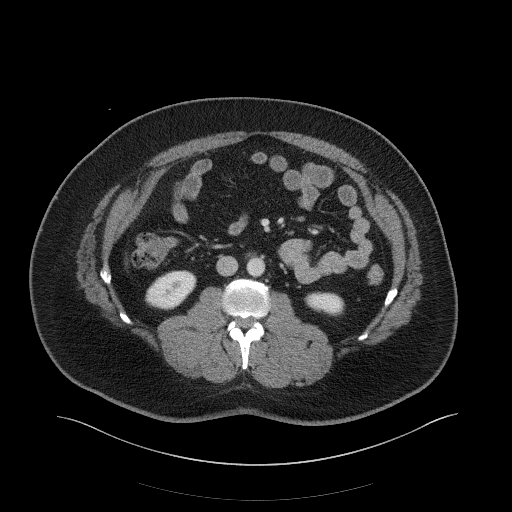
[im 67/101  soft-tissue]
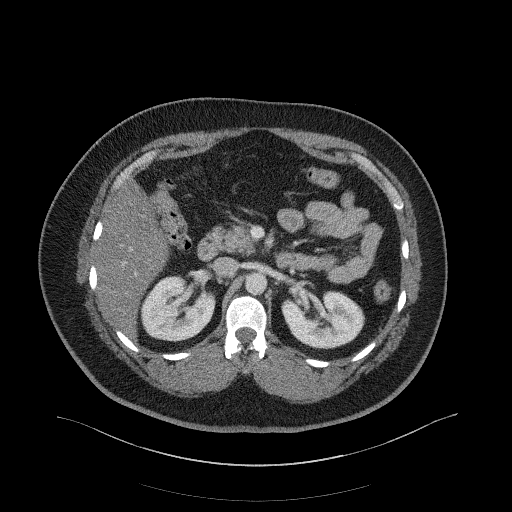
[im 67/101  bone]
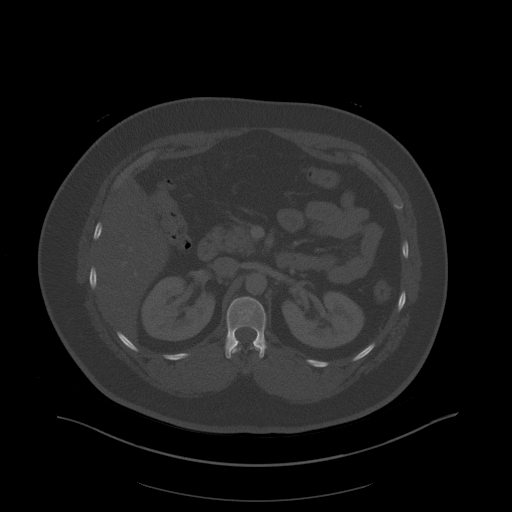
[im 73/101  soft-tissue]
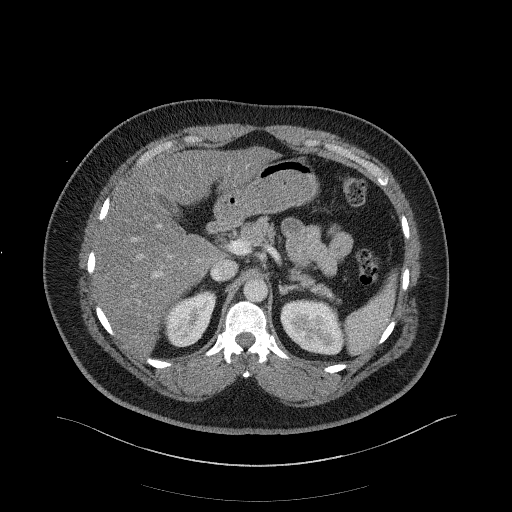
[im 78/101  soft-tissue]
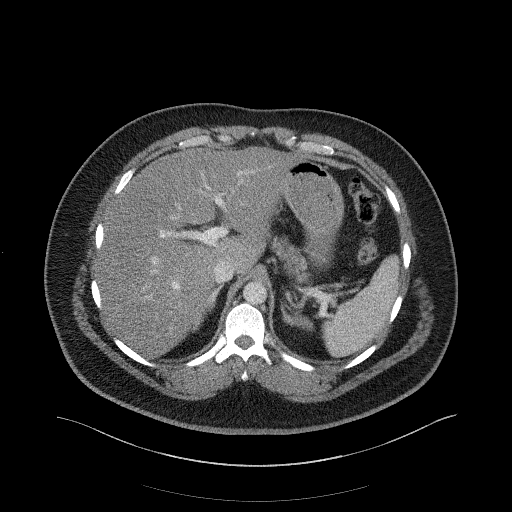
[im 89/101  soft-tissue]
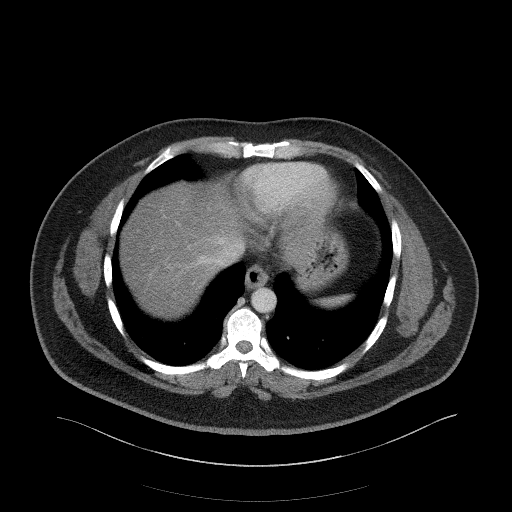
[im 95/101  soft-tissue]
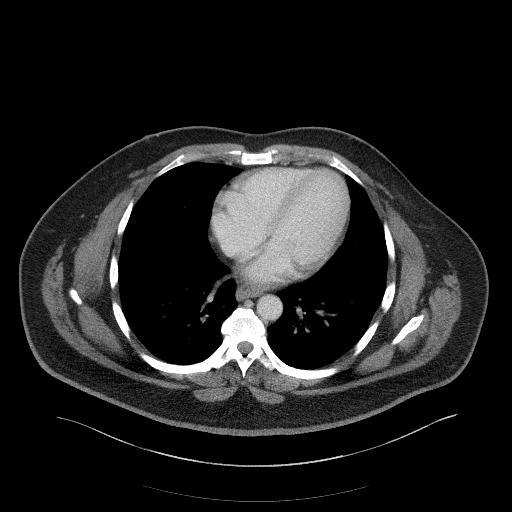

[Series 6: a/p w/ cor · coronal · 0.89mm/px · 3 of 173 slices shown]
[im 58/173  soft-tissue]
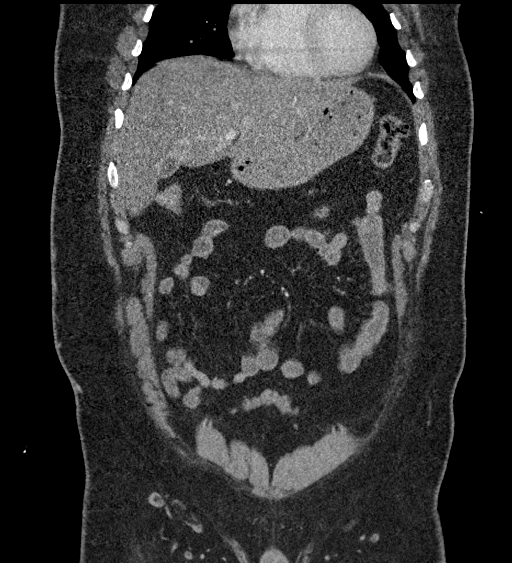
[im 77/173  soft-tissue]
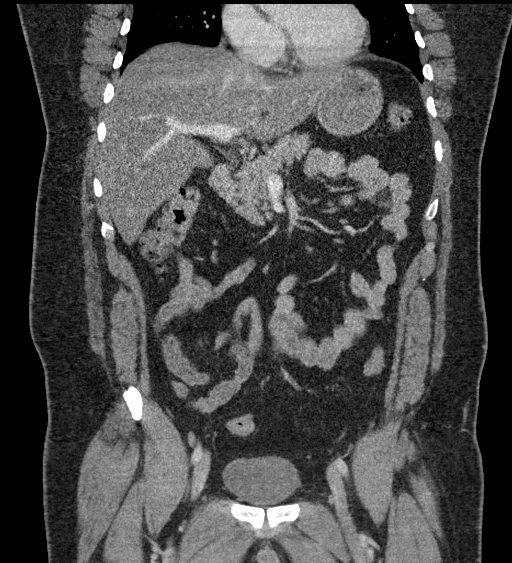
[im 96/173  soft-tissue]
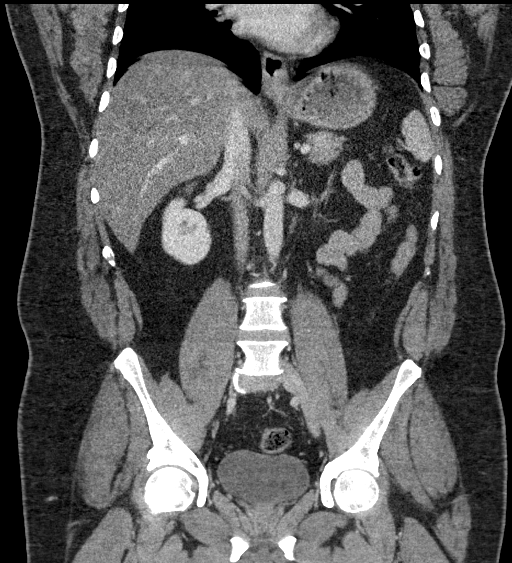

[16 of 46 positions shown; findings below may reference images not displayed]

FINDINGS: Lower chest: Mild hazy subsegmental atelectatic changes present
dependently within the visualized lung bases. Visualized lungs are
otherwise clear.

Hepatobiliary: Diffuse hypoattenuation of the liver consistent with
steatosis. Liver otherwise unremarkable. Gallbladder within normal
limits. No biliary dilatation.

Pancreas: Pancreas within normal limits.

Spleen: Spleen within normal limits.

Adrenals/Urinary Tract: Adrenal glands are normal. Kidneys equal in
size with symmetric enhancement. No nephrolithiasis, hydronephrosis,
or focal enhancing renal mass. No hydroureter. Bladder within normal
limits.

Stomach/Bowel: Stomach within normal limits. No evidence for bowel
obstruction. Appendix normal. No abnormal wall thickening, mucosal
enhancement, or inflammatory fat stranding seen about the bowels.

Vascular/Lymphatic: Normal intravascular enhancement seen throughout
the intra- abdominal aorta and its branch vessels. No adenopathy.

Reproductive: Prostate normal.

Other: Small fat containing left inguinal hernia noted without
associated inflammation. No other hernia identified. No free air or
fluid.

Musculoskeletal: No acute osseus abnormality. No worrisome lytic or
blastic osseous lesions. Mild multilevel degenerative changes noted
within the visualized spine.
IMPRESSION: 1. Small fat containing left inguinal hernia without associated
inflammation.
2. No other acute intra-abdominal or pelvic process identified. No
other hernia.
3. Hepatic steatosis.

## 2018-07-06 ENCOUNTER — Encounter (INDEPENDENT_AMBULATORY_CARE_PROVIDER_SITE_OTHER): Payer: Self-pay | Admitting: Family Medicine

## 2018-07-06 ENCOUNTER — Other Ambulatory Visit: Payer: Self-pay

## 2018-07-06 ENCOUNTER — Ambulatory Visit (INDEPENDENT_AMBULATORY_CARE_PROVIDER_SITE_OTHER): Payer: Self-pay | Admitting: Family Medicine

## 2018-07-06 VITALS — BP 156/81 | HR 71 | Temp 98.1°F | Ht 64.0 in | Wt 272.6 lb

## 2018-07-06 DIAGNOSIS — H7292 Unspecified perforation of tympanic membrane, left ear: Secondary | ICD-10-CM

## 2018-07-06 DIAGNOSIS — H6692 Otitis media, unspecified, left ear: Secondary | ICD-10-CM

## 2018-07-06 DIAGNOSIS — E119 Type 2 diabetes mellitus without complications: Secondary | ICD-10-CM | POA: Insufficient documentation

## 2018-07-06 DIAGNOSIS — Z6841 Body Mass Index (BMI) 40.0 and over, adult: Secondary | ICD-10-CM

## 2018-07-06 DIAGNOSIS — R030 Elevated blood-pressure reading, without diagnosis of hypertension: Secondary | ICD-10-CM

## 2018-07-06 LAB — POCT GLYCOSYLATED HEMOGLOBIN (HGB A1C): Hemoglobin A1C: 7.8 % — AB (ref 4.0–5.6)

## 2018-07-06 MED ORDER — AMOXICILLIN-POT CLAVULANATE 500-125 MG PO TABS: 1.0000 | ORAL_TABLET | Freq: Two times a day (BID) | ORAL | 0 refills | Status: AC

## 2018-07-06 NOTE — Progress Notes (Signed)
Subjective:    Patient ID: John Coleman, male    DOB: [birth year], 43 y.o.   MRN: [id number]  HPI       43 yo diabetic  male who is seen secondary to the complaint of possible ear infection or build-up of ear wax.  Patient was last seen in follow-up of diabetes on 07/27/2017 at which time his hemoglobin A1c was 6.5.  Patient's diabetes medications include Amaryl 2 mg in the morning as well as metformin 500 mg twice daily.  Patient was also previously prescribed phentermine 15 mg daily for weight loss.      At today's visit, patient with complaint of onset of bilateral ear pain 2 weeks ago but now for the past 4 days he is only had pain in the left ear.  Patient states that he has had constant throbbing pain in the left ear which was about an 8 on a 0-to-10 scale with 10 being the worst pain imaginable, which occurred yesterday and lasted for a few hours and patient also felt as if he had a fever at that time and had pain in his left upper neck near his ear.  Patient states that his pain at today's visit is about a 3-4 on a 0-to-10 scale.  Patient states that he feels as if when he showers, he gets water in his ears and that this causes irritation/discomfort in his ears.  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  Patient also feels as if his ears are clogged up at times, especially after showers and patient states that he believes he needs to have the wax removed from his ears.  Patient states that he has been using over-the-counter medication for earwax removal without success.  Patient however did pull a large piece of cotton type material out of the right ear recently which he believes was from a Q-tip.  Patient denies any dizziness or loss of balance at this time.  Patient has had no headaches.  Patient denies any chills.  Patient has had no shortness of breath or cough.  Patient  does not feel as if he has had any significant nasal congestion recently.  Patient denies any sore throat.      Patient reports that he continues to take his diabetes medications daily.  Patient reports that he does not check his home blood sugars on a regular basis.  Patient denies any current increased thirst, no urinary frequency.  Patient states that he was told that he would need to follow-up in 1 year from his last visit regarding his diabetes.  Patient states that he was not sure when he needs to schedule this follow-up visit.  Patient has not had anything to eat or drink prior to today's visit.  Past Medical History:  Diagnosis Date  . Morbid obesity (HCC)   . Type 2 diabetes mellitus (HCC)    Past Surgical History:  Procedure Laterality Date  . FINGER SURGERY Right    Family History  Problem Relation Age of Onset  . Diabetes Mother    Social History   Tobacco Use  . Smoking status: Never Smoker  . Smokeless tobacco: Never Used  Substance Use Topics  . Alcohol use: No    Frequency: Never  . Drug use: Not on file  No Known Allergies   Review of Systems  Constitutional:  Positive for fever (felt feverish yesterday). Negative for chills and fatigue.  HENT: Negative for congestion, hearing loss and sore throat.   Eyes: Negative for photophobia and visual disturbance.  Respiratory: Negative for cough and shortness of breath.   Cardiovascular: Negative for chest pain, palpitations and leg swelling.  Gastrointestinal: Negative for abdominal pain and nausea.  Endocrine: Negative for polydipsia and polyuria.  Genitourinary: Negative for dysuria and frequency.  Musculoskeletal: Negative for back pain and gait problem.  Neurological: Negative for dizziness and headaches.  Hematological: Negative for adenopathy. Does not bruise/bleed easily.       Objective:   Physical Exam BP (!) 156/81 (BP Location: Left Arm, Patient Position: Sitting, Cuff Size: Large)   Pulse 71   Temp  98.1 F (36.7 C) (Oral)   Ht 5\' 4"  (1.626 m)   Wt 272 lb 9.6 oz (123.7 kg)   SpO2 94%   BMI 46.79 kg/m Nurse's notes and vital signs reviewed General- well-nourished well-developed morbidly obese male in no acute distress ENT- right TM is mostly obscured by the presence of impacted cerumen however patient also with whitish area which may or may not represent the TM which is slightly visible; left TM is mostly obscured by impacted cerumen but patient also with a greenish discharge within the ear canal and the small portion of the left TM that is visible is erythematous and thickened; nares with normal appearance, patient with large tongue base and very narrowed posterior airway which was difficult to visualize but appears to be narrowed likely related to body habitus Neck-supple, patient with posterior cervical chain lymphadenopathy just below the right ear which is slightly tender to palpation Lungs-clear to auscultation bilaterally Cardiovascular-regular rate and rhythm Abdomen-truncal obesity soft and nontender Back-no CVA tenderness      Assessment & Plan:  1. Type 2 diabetes mellitus without complication, without long-term current use of insulin (HCC) As patient with type 2 diabetes and has not been seen since February of last year, patient had hemoglobin A1c at today's visit which was elevated at 7.8.  Patient reports that he does continue to take his home medications but is only checking his blood sugars infrequently.  Patient is fasting at today's visit and will have CMP, lipid panel and urine creatinine microalbumin.  Patient will return in approximately 2 weeks for recheck of his ear infection and at that time, patient will have diabetic foot exam as well as referral to have diabetic eye exam and will likely need an increase in his dose of Amaryl to 4 mg.  Patient is encouraged to follow a low carbohydrate diet and to begin checking his blood sugars. - HgB A1c - Microalbumin/Creatinine  Ratio, Urine - Lipid Panel - Comprehensive metabolic panel  2. Left otitis media with spontaneous rupture of eardrum Patient with some wax buildup which will prevent use of eardrops and patient will likely have better results with use of oral antibiotic therefore Augmentin 500 mg twice daily prescribed and patient is to take the medication after eating to help prevent stomach upset.  Patient may take over-the-counter pain medication as needed for ear pain.  Patient should return if he has increased pain, sensation of enlarged lymph nodes or neck pain, further issues with hearing or any concerns.  After patient has completed antibiotics and ear pain has resolved, patient is encouraged to schedule a nurse visit for ear lavage for removal of impacted cerumen. - amoxicillin-clavulanate (AUGMENTIN) 500-125 MG tablet; Take 1 tablet (500 mg total) by mouth  2 (two) times daily. Take after eating  Dispense: 20 tablet; Refill: 0  3. Morbid obesity (HCC) Patient with morbid obesity and patient is encouraged to adopt a low carbohydrate, low-fat diet and begin a regular regimen of cardiovascular exercise to help with weight loss.  This will also help with insulin resistance and improve long-term control of diabetes  4. Elevated blood pressure reading Patient with elevated blood pressure and patient should return for blood pressure recheck and patient would likely benefit from medication such as an ACE inhibitor or ARB to help with renal protection as well as control of blood pressure as patient is diabetic. DASH Diet and weight loss recommended.  An After Visit Summary was printed and given to the patient.  Return in about 2 weeks (around 07/20/2018) for f/u ear infection/DM/needs ear lavage.

## 2018-07-06 NOTE — Patient Instructions (Signed)
Otitis media en los adultos  Otitis Media, Adult    Otitis media significa que el odo medio est rojo e hinchado (inflamado) y lleno de lquido. Generalmente, la afeccin desaparece sin tratamiento.  Siga estas indicaciones en su casa:   Tome los medicamentos de venta libre y los recetados solamente como se lo haya indicado el mdico.   Si le recetaron un antibitico, tmelo como se lo haya indicado el mdico. No deje de tomar el antibitico aunque comience a sentirse mejor.   Concurra a todas las visitas de control como se lo haya indicado el mdico. Esto es importante.  Comunquese con un mdico si:   Le sangra la nariz.   Tiene un bulto en el cuello.   No mejora luego de 5das.   Empeora en lugar de mejorar.  Solicite ayuda de inmediato si:   Siente dolor que no se alivia con los medicamentos.   Tiene hinchazn, enrojecimiento o dolor en el odo.   Tiene rigidez en el cuello.   No puede mover una parte de la cara (parlisis).   Nota que el hueso que se encuentra detrs de la oreja le duele al tocarlo.   Siente un dolor de cabeza muy intenso.  Resumen   Otitis media significa que el odo medio est rojo, hinchado y lleno de lquido.   Generalmente, esta afeccin desaparece sin tratamiento. En algunos casos, puede no ser necesario el tratamiento.   Si le recetaron un antibitico, tmelo como se lo haya indicado el mdico.  Esta informacin no tiene como fin reemplazar el consejo del mdico. Asegrese de hacerle al mdico cualquier pregunta que tenga.  Document Released: 07/05/2010 Document Revised: 03/12/2017 Document Reviewed: 12/28/2012  Elsevier Interactive Patient Education  2019 [employer]

## 2018-07-07 LAB — COMPREHENSIVE METABOLIC PANEL WITH GFR
ALT: 42 IU/L (ref 0–44)
AST: 30 IU/L (ref 0–40)
Albumin/Globulin Ratio: 1.3 (ref 1.2–2.2)
Albumin: 4.4 g/dL (ref 4.0–5.0)
Alkaline Phosphatase: 83 IU/L (ref 39–117)
BUN/Creatinine Ratio: 15 (ref 9–20)
BUN: 13 mg/dL (ref 6–24)
Bilirubin Total: 0.5 mg/dL (ref 0.0–1.2)
CO2: 25 mmol/L (ref 20–29)
Calcium: 9.3 mg/dL (ref 8.7–10.2)
Chloride: 98 mmol/L (ref 96–106)
Creatinine, Ser: 0.89 mg/dL (ref 0.76–1.27)
GFR calc Af Amer: 122 mL/min/1.73
GFR calc non Af Amer: 105 mL/min/1.73
Globulin, Total: 3.4 g/dL (ref 1.5–4.5)
Glucose: 140 mg/dL — ABNORMAL HIGH (ref 65–99)
Potassium: 4 mmol/L (ref 3.5–5.2)
Sodium: 140 mmol/L (ref 134–144)
Total Protein: 7.8 g/dL (ref 6.0–8.5)

## 2018-07-07 LAB — LIPID PANEL
Chol/HDL Ratio: 3.4 ratio (ref 0.0–5.0)
Cholesterol, Total: 137 mg/dL (ref 100–199)
HDL: 40 mg/dL
LDL Calculated: 73 mg/dL (ref 0–99)
Triglycerides: 118 mg/dL (ref 0–149)
VLDL Cholesterol Cal: 24 mg/dL (ref 5–40)

## 2018-07-07 LAB — MICROALBUMIN / CREATININE URINE RATIO
Creatinine, Urine: 312.2 mg/dL
Microalb/Creat Ratio: 17 mg/g{creat} (ref 0–29)
Microalbumin, Urine: 52.8 ug/mL

## 2018-07-09 ENCOUNTER — Telehealth (INDEPENDENT_AMBULATORY_CARE_PROVIDER_SITE_OTHER): Payer: Self-pay

## 2018-07-09 NOTE — Telephone Encounter (Signed)
Call placed using pacific interpreter [phone]) left voicemail detailing the following. Creatinine microalbumin ratio was normal. Patient with normal lipid panel. Patient's LDL/bad cholesterol is very near goal of 70 or less as his LDL is currently 73. Patient's complete metabolic panel was normal with the exception of glucose of 140. Patient should continue to make sure that his blood sugars and blood pressure are well controlled and that he remains well-hydrated with water. Return call to [facility] at [phone] with any questions or concerns. Maryjean Morn, CMA

## 2018-07-09 NOTE — Telephone Encounter (Signed)
-----   Message from Cain Saupe, MD sent at 07/09/2018 12:22 AM EST ----- Please notify patient that his creatinine microalbumin ratio was normal.  Patient with normal lipid panel.  Patient's LDL/bad cholesterol is very near goal of 70 or less as his LDL is currently 73.  Patient's complete metabolic panel was normal with the exception of glucose of 140.  Patient should continue to make sure that his blood sugars and blood pressure are well controlled and that he remains well-hydrated with water.

## 2018-07-20 ENCOUNTER — Other Ambulatory Visit: Payer: Self-pay | Admitting: Family Medicine

## 2018-07-20 DIAGNOSIS — E119 Type 2 diabetes mellitus without complications: Secondary | ICD-10-CM

## 2018-07-20 MED ORDER — GLIMEPIRIDE 2 MG PO TABS: 2.0000 mg | ORAL_TABLET | Freq: Every day | ORAL | 1 refills | Status: AC

## 2018-07-20 NOTE — Progress Notes (Signed)
Received prescription refill request for Amaryl 2 mg #90 to patient's pharmacy.  Refill prescription sent to [facility] on [address]

## 2018-07-21 ENCOUNTER — Ambulatory Visit (INDEPENDENT_AMBULATORY_CARE_PROVIDER_SITE_OTHER): Payer: Self-pay | Admitting: Nurse Practitioner

## 2020-08-23 ENCOUNTER — Emergency Department (HOSPITAL_COMMUNITY): Payer: PRIVATE HEALTH INSURANCE

## 2020-08-23 ENCOUNTER — Emergency Department (HOSPITAL_COMMUNITY)
Admission: EM | Admit: 2020-08-23 | Discharge: 2020-08-23 | Disposition: A | Discharge status: Home / Self Care | Payer: PRIVATE HEALTH INSURANCE | Attending: Emergency Medicine | Admitting: Emergency Medicine

## 2020-08-23 ENCOUNTER — Encounter (HOSPITAL_COMMUNITY): Payer: Self-pay

## 2020-08-23 ENCOUNTER — Other Ambulatory Visit: Payer: Self-pay

## 2020-08-23 DIAGNOSIS — Z7984 Long term (current) use of oral hypoglycemic drugs: Secondary | ICD-10-CM | POA: Diagnosis not present

## 2020-08-23 DIAGNOSIS — S93401A Sprain of unspecified ligament of right ankle, initial encounter: Secondary | ICD-10-CM

## 2020-08-23 DIAGNOSIS — W230XXA Caught, crushed, jammed, or pinched between moving objects, initial encounter: Secondary | ICD-10-CM | POA: Diagnosis not present

## 2020-08-23 DIAGNOSIS — Y990 Civilian activity done for income or pay: Secondary | ICD-10-CM | POA: Diagnosis not present

## 2020-08-23 DIAGNOSIS — S93402A Sprain of unspecified ligament of left ankle, initial encounter: Secondary | ICD-10-CM | POA: Insufficient documentation

## 2020-08-23 DIAGNOSIS — S99912A Unspecified injury of left ankle, initial encounter: Secondary | ICD-10-CM | POA: Diagnosis present

## 2020-08-23 DIAGNOSIS — E119 Type 2 diabetes mellitus without complications: Secondary | ICD-10-CM | POA: Diagnosis not present

## 2020-08-23 MED ORDER — NAPROXEN 500 MG PO TABS: 500.0000 mg | ORAL_TABLET | Freq: Two times a day (BID) | ORAL | 0 refills | Status: AC

## 2020-08-23 MED ORDER — OXYCODONE-ACETAMINOPHEN 5-325 MG PO TABS
1.0000 | ORAL_TABLET | Freq: Once | ORAL | Status: AC
  Administered 2020-08-23: 1 via ORAL
  Filled 2020-08-23: qty 1

## 2020-08-23 NOTE — ED Triage Notes (Signed)
Pt BIB EMS from work. Pt reports his right foot got caught in a ladder when it started sliding away. Pt caught himself on the wall. Pt endorses right ankle pain and swelling.

## 2020-08-23 NOTE — Discharge Instructions (Signed)
Le he recetado medicina para ayudar con Chief Technology Officer. Tome una tableta, dos veces al dia para Engineer, materials.  Eleve su pie cuando este en casa

## 2020-08-23 NOTE — ED Provider Notes (Signed)
[place] DEPT Provider Note   CSN: [id number] Arrival date & time: 08/23/20  1101     History Chief Complaint  Patient presents with  . Ankle Pain    John Coleman is a 45 y.o. male.  45 y.o male with a PMH of Morbid, DM presents to the ED with a chief complaint of right ankle pain s/p fall. Patient was working on top of an Producer, television/film/video.  Patient reports he was on top of the escalator when he suddenly fell, attempted to catch himself with his arms, reports his right foot got caught on the escalator, there is pain along the posterior aspect of the ankle joint, there is swelling noted to the area exacerbated with movement along with adduction.  No open wounds or lacerations noted.  No other injury reported.  The history is provided by the patient.  Ankle Pain Location:  Ankle Time since incident:  2 hours Ankle location:  L ankle Pain details:    Quality:  Sharp and throbbing   Radiates to:  Does not radiate   Severity:  Mild   Onset quality:  Sudden   Timing:  Constant   Progression:  Worsening Chronicity:  New Dislocation: no   Tetanus status:  Unknown Prior injury to area:  Unable to specify Relieved by:  Nothing Worsened by:  Adduction Ineffective treatments:  None tried Associated symptoms: no fever   Risk factors: obesity   Risk factors: no frequent fractures and no known bone disorder        Past Medical History:  Diagnosis Date  . Morbid obesity ([place])   . Type 2 diabetes mellitus [facility])     Patient Active Problem List   Diagnosis Date Noted  . Type 2 diabetes mellitus without complication, without long-term current use of insulin ([place]) 07/06/2018  . Morbid obesity ([place]) 07/06/2018    Past Surgical History:  Procedure Laterality Date  . FINGER SURGERY Right        Family History  Problem Relation Age of Onset  . Diabetes Mother     Social History   Tobacco Use  . Smoking status: Never Smoker  . Smokeless  tobacco: Never Used  Substance Use Topics  . Alcohol use: No    Home Medications Prior to Admission medications   Medication Sig Start Date End Date Taking? Authorizing Provider  naproxen (NAPROSYN) 500 MG tablet Take 1 tablet (500 mg total) by mouth 2 (two) times daily for 7 days. 08/23/20 08/30/20 Yes Matt Delpizzo, PA-C  amoxicillin-clavulanate (AUGMENTIN) 500-125 MG tablet Take 1 tablet (500 mg total) by mouth 2 (two) times daily. Take after eating 07/06/18   Fulp, Cammie, MD  blood glucose meter kit and supplies KIT Dispense based on patient and insurance preference. Use up to four times daily as directed. (FOR ICD-9 250.00, 250.01). 01/16/17   Clent Demark, PA-C  glimepiride (AMARYL) 2 MG tablet Take 1 tablet (2 mg total) by mouth daily before breakfast. 07/20/18   Fulp, Cammie, MD  metFORMIN (GLUCOPHAGE) 500 MG tablet Take 1 tablet (500 mg total) by mouth 2 (two) times daily with a meal. 04/17/17   Clent Demark, PA-C    Allergies    Patient has no known allergies.  Review of Systems   Review of Systems  Constitutional: Negative for fever.  Musculoskeletal: Positive for arthralgias.    Physical Exam Updated Vital Signs BP (!) 158/95 (BP Location: Left Arm)   Pulse 71   Temp 98.8 F (37.1  C) (Oral)   Resp 18   SpO2 97%   Physical Exam Vitals and nursing note reviewed.  Constitutional:      Appearance: Normal appearance.  HENT:     Head: Normocephalic and atraumatic.     Nose: Nose normal.     Mouth/Throat:     Mouth: Mucous membranes are moist.  Cardiovascular:     Rate and Rhythm: Normal rate.     Pulses:          Dorsalis pedis pulses are 2+ on the left side.       Posterior tibial pulses are 2+ on the left side.  Pulmonary:     Effort: Pulmonary effort is normal.  Abdominal:     General: Abdomen is flat.     Tenderness: There is no abdominal tenderness. There is no right CVA tenderness or left CVA tenderness.  Musculoskeletal:     Cervical back: Normal  range of motion and neck supple.     Left ankle: Swelling present. No deformity, ecchymosis or lacerations. Tenderness present. Decreased range of motion.  Feet:     Left foot:     Skin integrity: No skin breakdown, erythema or warmth.     Toenail Condition: Left toenails are normal.  Skin:    General: Skin is warm and dry.  Neurological:     Mental Status: He is alert and oriented to person, place, and time.     ED Results / Procedures / Treatments   Labs (all labs ordered are listed, but only abnormal results are displayed) Labs Reviewed - No data to display  EKG None  Radiology DG Ankle Complete Right  Result Date: 08/23/2020 CLINICAL DATA:  Fall. EXAM: RIGHT ANKLE - COMPLETE 3+ VIEW COMPARISON:  No prior. FINDINGS: Prominent diffuse soft tissue swelling. Tiny well-circumscribed bony densities noted adjacent to the medial and lateral malleoli. These may represent tiny fracture fragments, possibly old. Slight deformity noted of the medial malleolus, most likely from prior injury. Tiny corticated bony density noted along the posterior aspect of midfoot. This most likely represents a tiny old fracture fragment. Degenerative changes noted about the right ankle and foot. No radiopaque foreign body. IMPRESSION: 1. Prominent diffuse soft tissue swelling. 2. Tiny well-circumscribed bony densities noted adjacent to the medial and lateral malleoli. These may represent tiny fracture fragments, possibly old. Slight deformity noted of the medial malleolus, most likely from prior injury. Tiny corticated bony density noted along the posterior aspect of the midfoot. This most likely represents a tiny old fracture fragment. 3. Degenerative changes noted about the right ankle and foot. Electronically Signed   By: Marcello Moores  Register   On: 08/23/2020 12:42    Procedures Procedures   Medications Ordered in ED Medications  oxyCODONE-acetaminophen (PERCOCET/ROXICET) 5-325 MG per tablet 1 tablet (1 tablet  Oral Given 08/23/20 1305)    ED Course  I have reviewed the triage vital signs and the nursing notes.  Pertinent labs & imaging results that were available during my care of the patient were reviewed by me and considered in my medical decision making (see chart for details).    MDM Rules/Calculators/A&P  Patient presents to the ED status post fall, reports he was on a ladder when he suddenly fell from a height, reports there was pain to the right foot, this was dorsiflexed up on the fall, there is pain to the lateral aspect of it along the place tendon, there is swelling to the ankle joint, no pain along the lateral  or medial mall.  Pulses are present, capillary refill is intact.  There is pain with palpation along the Achilles, some suspicion for sprain, will place patient on a Velcro splint.  Vitals are within normal limits, patient will be given in addition crutches.  DG ankle right:  1. Prominent diffuse soft tissue swelling.  2. Tiny well-circumscribed bony densities noted adjacent to the  medial and lateral malleoli. These may represent tiny fracture  fragments, possibly old. Slight deformity noted of the medial  malleolus, most likely from prior injury. Tiny corticated bony  density noted along the posterior aspect of the midfoot. This most  likely represents a tiny old fracture fragment.  3. Degenerative changes noted about the right ankle and foot.    Discussed his results with patient at length, patient stable for discharge at this time.  Return precautions discussed.  Portions of this note were generated with Lobbyist. Dictation errors may occur despite best attempts at proofreading.  Final Clinical Impression(s) / ED Diagnoses Final diagnoses:  Sprain of right ankle, unspecified ligament, initial encounter    Rx / DC Orders ED Discharge Orders         Ordered    naproxen (NAPROSYN) 500 MG tablet  2 times daily        08/23/20 1319            Janeece Fitting, PA-C 08/23/20 1358    Breck Coons, MD 08/24/20 [phone]

## 2021-12-31 IMAGING — DX DG ANKLE COMPLETE 3+V*R*
3 series · 3 of 3 positions shown · non-contrast
Comparison: No prior.

CLINICAL DATA: Fall.

EXAM:
RIGHT ANKLE - COMPLETE 3+ VIEW

[ankle ap]
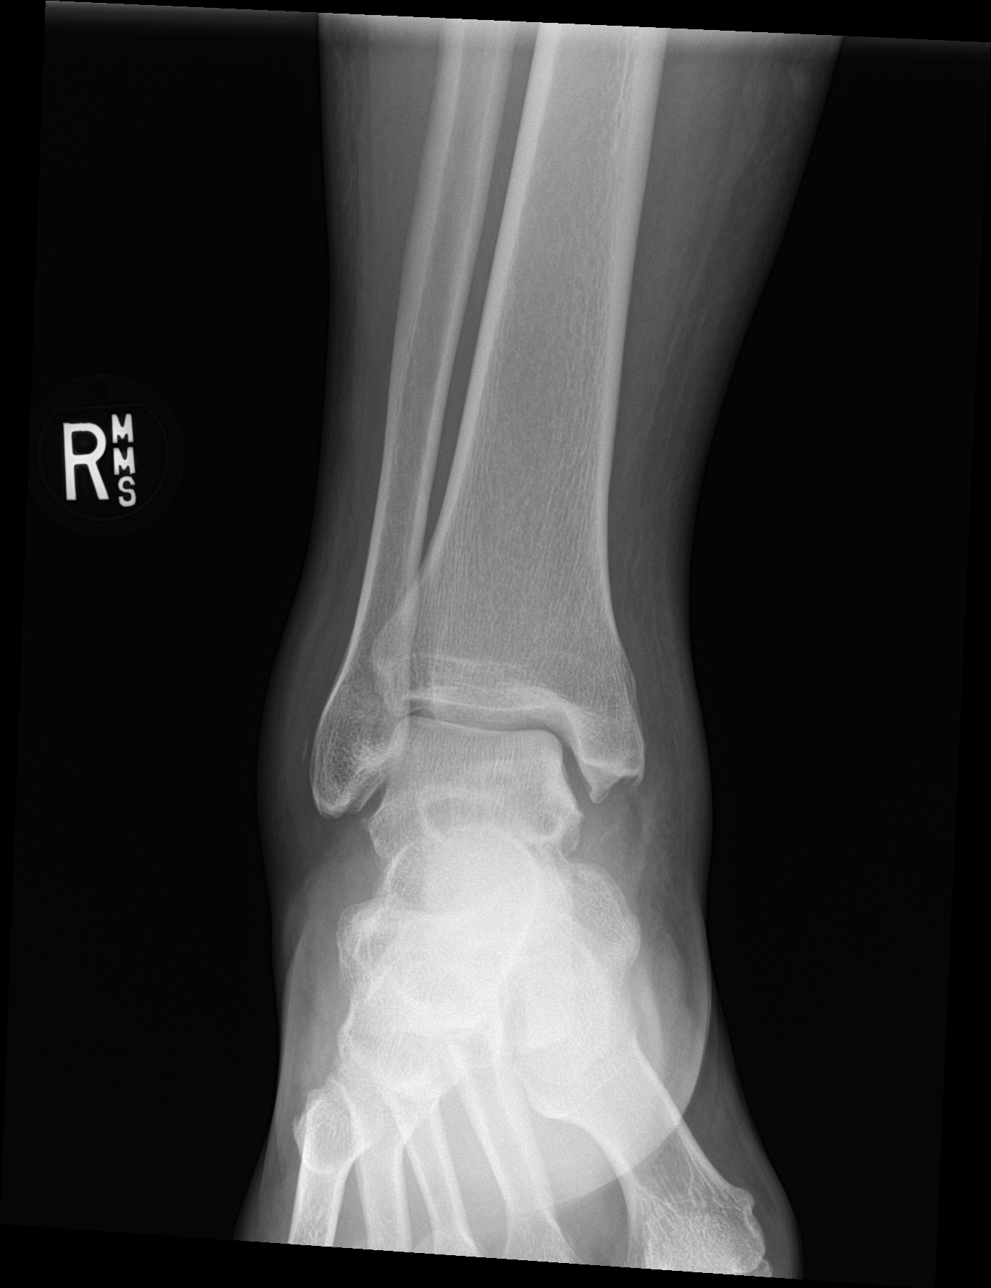

[ankle obl]
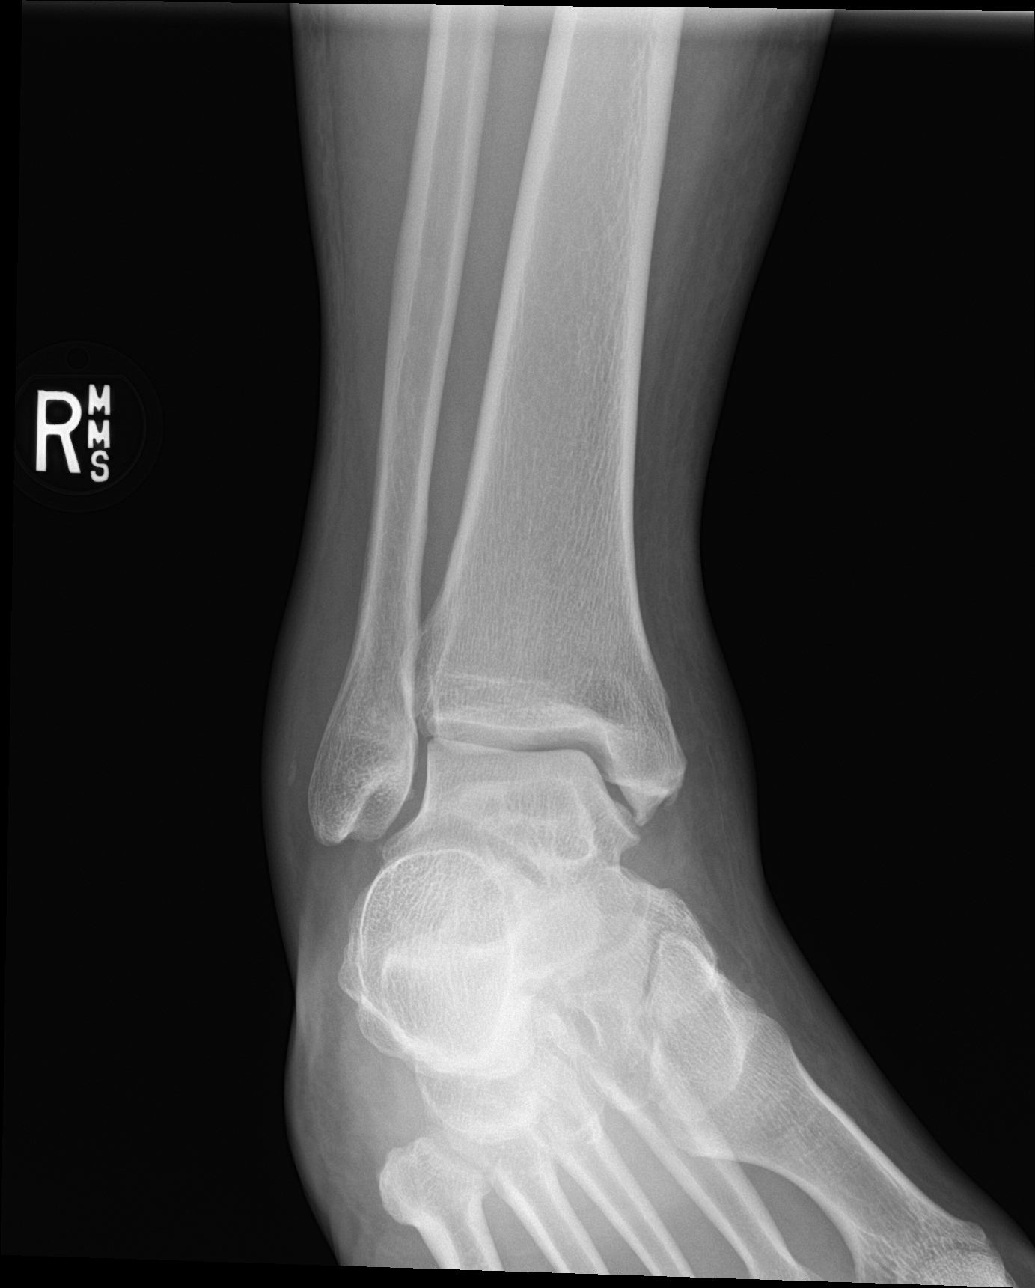

[ankle lat]
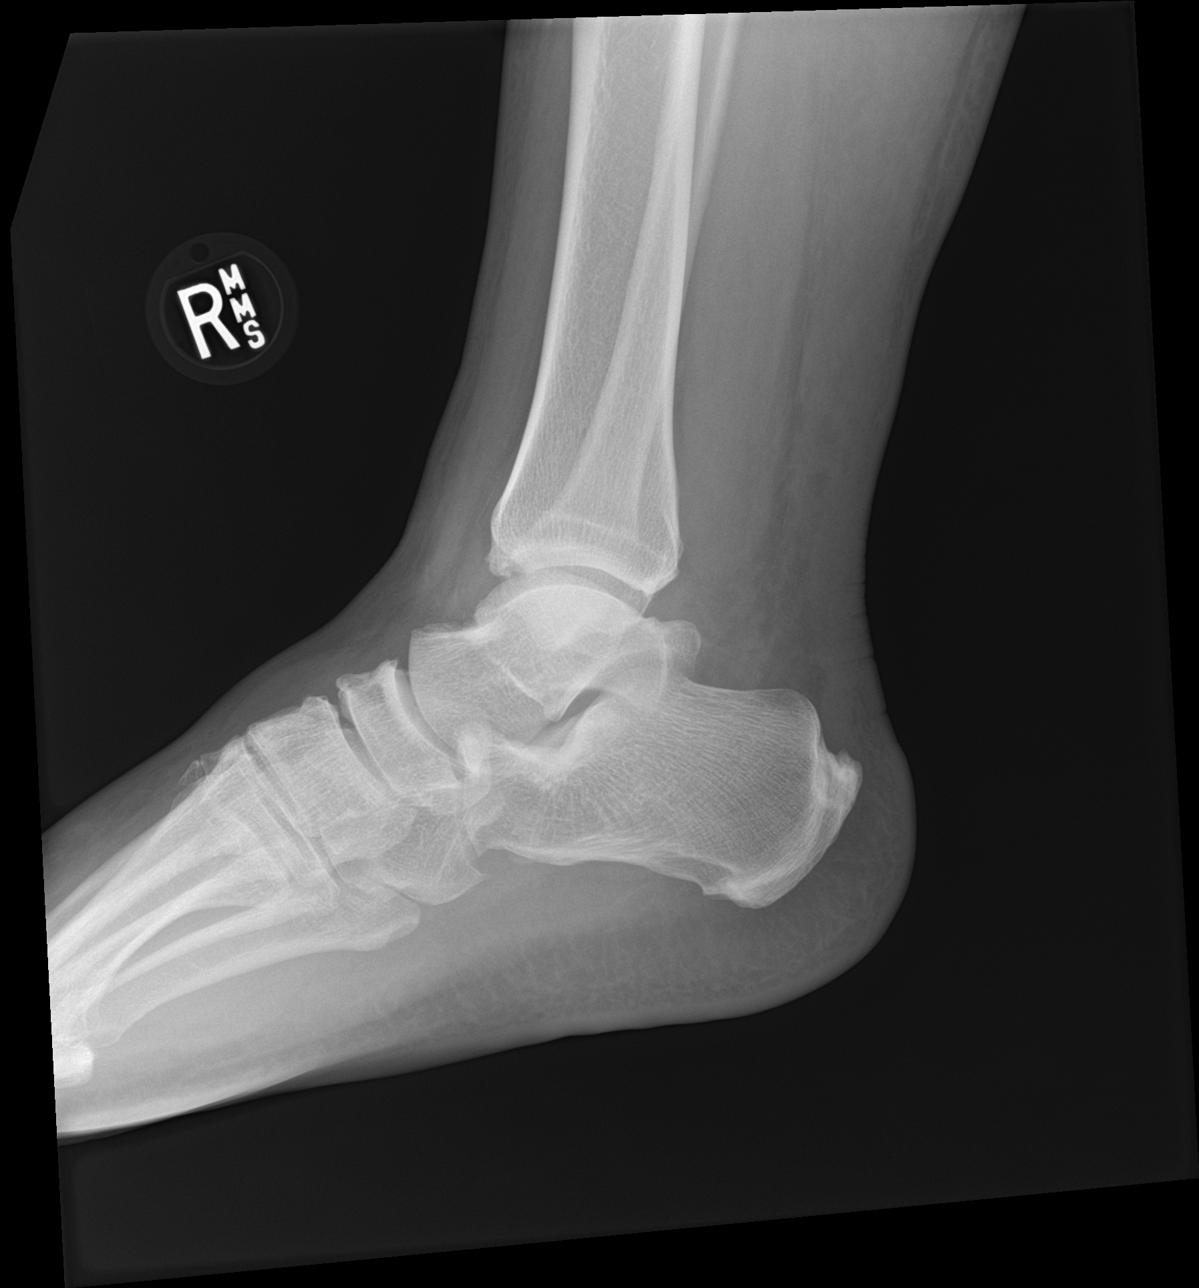

[3 of 3 positions shown; findings below may reference images not displayed]

FINDINGS: Prominent diffuse soft tissue swelling. Tiny well-circumscribed bony
densities noted adjacent to the medial and lateral malleoli. These
may represent tiny fracture fragments, possibly old. Slight
deformity noted of the medial malleolus, most likely from prior
injury. Tiny corticated bony density noted along the posterior
aspect of midfoot. This most likely represents a tiny old fracture
fragment. Degenerative changes noted about the right ankle and foot.
No radiopaque foreign body.
IMPRESSION: 1. Prominent diffuse soft tissue swelling.
2. Tiny well-circumscribed bony densities noted adjacent to the
medial and lateral malleoli. These may represent tiny fracture
fragments, possibly old. Slight deformity noted of the medial
malleolus, most likely from prior injury. Tiny corticated bony
density noted along the posterior aspect of the midfoot. This most
likely represents a tiny old fracture fragment.
3. Degenerative changes noted about the right ankle and foot.
